# Patient Record
Sex: Male | Born: 1965 | Race: White | Hispanic: No | Marital: Married | Smoking: Current every day smoker
Health system: Southern US, Community
[De-identification: ages and names within clinical notes are randomized; demographics above are authoritative.]

## PROBLEM LIST (undated history)

## (undated) DIAGNOSIS — M1990 Unspecified osteoarthritis, unspecified site: Secondary | ICD-10-CM

## (undated) DIAGNOSIS — E079 Disorder of thyroid, unspecified: Secondary | ICD-10-CM

## (undated) DIAGNOSIS — Z973 Presence of spectacles and contact lenses: Secondary | ICD-10-CM

## (undated) DIAGNOSIS — Z789 Other specified health status: Secondary | ICD-10-CM

## (undated) DIAGNOSIS — T7840XA Allergy, unspecified, initial encounter: Secondary | ICD-10-CM

## (undated) HISTORY — DX: Allergy, unspecified, initial encounter: T78.40XA

## (undated) HISTORY — PX: TONSILLECTOMY: SUR1361

## (undated) HISTORY — DX: Disorder of thyroid, unspecified: E07.9

## (undated) HISTORY — DX: Unspecified osteoarthritis, unspecified site: M19.90

---

## 2004-11-24 ENCOUNTER — Emergency Department (HOSPITAL_COMMUNITY): Admission: EM | Admit: 2004-11-24 | Discharge: 2004-11-24 | Payer: Self-pay | Admitting: Emergency Medicine

## 2007-09-18 ENCOUNTER — Emergency Department (HOSPITAL_COMMUNITY): Admission: EM | Admit: 2007-09-18 | Discharge: 2007-09-18 | Payer: Self-pay | Admitting: Emergency Medicine

## 2013-10-22 ENCOUNTER — Other Ambulatory Visit: Payer: Self-pay | Admitting: Orthopedic Surgery

## 2013-10-22 ENCOUNTER — Encounter (HOSPITAL_BASED_OUTPATIENT_CLINIC_OR_DEPARTMENT_OTHER): Payer: Self-pay | Admitting: *Deleted

## 2013-10-22 NOTE — Progress Notes (Signed)
No labs needed

## 2013-10-26 ENCOUNTER — Encounter (HOSPITAL_BASED_OUTPATIENT_CLINIC_OR_DEPARTMENT_OTHER): Payer: Managed Care, Other (non HMO) | Admitting: Anesthesiology

## 2013-10-26 ENCOUNTER — Ambulatory Visit (HOSPITAL_BASED_OUTPATIENT_CLINIC_OR_DEPARTMENT_OTHER)
Admission: RE | Admit: 2013-10-26 | Discharge: 2013-10-26 | Disposition: A | Discharge status: Home / Self Care | Payer: Managed Care, Other (non HMO) | Source: Ambulatory Visit | Attending: Orthopedic Surgery | Admitting: Orthopedic Surgery

## 2013-10-26 ENCOUNTER — Ambulatory Visit (HOSPITAL_BASED_OUTPATIENT_CLINIC_OR_DEPARTMENT_OTHER): Payer: Managed Care, Other (non HMO) | Admitting: Anesthesiology

## 2013-10-26 ENCOUNTER — Encounter (HOSPITAL_BASED_OUTPATIENT_CLINIC_OR_DEPARTMENT_OTHER): Payer: Self-pay | Admitting: Orthopedic Surgery

## 2013-10-26 ENCOUNTER — Encounter (HOSPITAL_BASED_OUTPATIENT_CLINIC_OR_DEPARTMENT_OTHER)
Admission: RE | Disposition: A | Discharge status: Home / Self Care | Payer: Self-pay | Source: Ambulatory Visit | Attending: Orthopedic Surgery

## 2013-10-26 DIAGNOSIS — Z6831 Body mass index (BMI) 31.0-31.9, adult: Secondary | ICD-10-CM | POA: Diagnosis not present

## 2013-10-26 DIAGNOSIS — R229 Localized swelling, mass and lump, unspecified: Secondary | ICD-10-CM | POA: Diagnosis present

## 2013-10-26 DIAGNOSIS — L723 Sebaceous cyst: Secondary | ICD-10-CM | POA: Insufficient documentation

## 2013-10-26 DIAGNOSIS — F17200 Nicotine dependence, unspecified, uncomplicated: Secondary | ICD-10-CM | POA: Insufficient documentation

## 2013-10-26 DIAGNOSIS — E669 Obesity, unspecified: Secondary | ICD-10-CM | POA: Diagnosis not present

## 2013-10-26 HISTORY — DX: Other specified health status: Z78.9

## 2013-10-26 HISTORY — PX: MASS EXCISION: SHX2000

## 2013-10-26 HISTORY — DX: Presence of spectacles and contact lenses: Z97.3

## 2013-10-26 LAB — POCT HEMOGLOBIN-HEMACUE: HEMOGLOBIN: 16 g/dL (ref 13.0–17.0)

## 2013-10-26 SURGERY — EXCISION MASS
Anesthesia: Monitor Anesthesia Care | Site: Finger | Laterality: Left

## 2013-10-26 MED ORDER — LACTATED RINGERS IV SOLN
INTRAVENOUS | Status: DC
  Administered 2013-10-26: 10:00:00 via INTRAVENOUS

## 2013-10-26 MED ORDER — FENTANYL CITRATE 0.05 MG/ML IJ SOLN: 50.0000 ug | INTRAMUSCULAR | Status: DC | PRN

## 2013-10-26 MED ORDER — FENTANYL CITRATE 0.05 MG/ML IJ SOLN
INTRAMUSCULAR | Status: AC
  Filled 2013-10-26: qty 2

## 2013-10-26 MED ORDER — PROMETHAZINE HCL 25 MG/ML IJ SOLN: 6.2500 mg | INTRAMUSCULAR | Status: DC | PRN

## 2013-10-26 MED ORDER — MEPERIDINE HCL 25 MG/ML IJ SOLN: 6.2500 mg | INTRAMUSCULAR | Status: DC | PRN

## 2013-10-26 MED ORDER — LIDOCAINE HCL (CARDIAC) 20 MG/ML IV SOLN
INTRAVENOUS | Status: DC | PRN
  Administered 2013-10-26: 50 mg via INTRAVENOUS

## 2013-10-26 MED ORDER — MIDAZOLAM HCL 5 MG/5ML IJ SOLN
INTRAMUSCULAR | Status: DC | PRN
  Administered 2013-10-26: 2 mg via INTRAVENOUS

## 2013-10-26 MED ORDER — FENTANYL CITRATE 0.05 MG/ML IJ SOLN
INTRAMUSCULAR | Status: DC | PRN
  Administered 2013-10-26: 100 ug via INTRAVENOUS

## 2013-10-26 MED ORDER — CEFAZOLIN SODIUM-DEXTROSE 2-3 GM-% IV SOLR
INTRAVENOUS | Status: AC
  Filled 2013-10-26: qty 50

## 2013-10-26 MED ORDER — BUPIVACAINE HCL (PF) 0.25 % IJ SOLN
INTRAMUSCULAR | Status: DC | PRN
  Administered 2013-10-26: 6 mL

## 2013-10-26 MED ORDER — CEFAZOLIN SODIUM-DEXTROSE 2-3 GM-% IV SOLR: 2.0000 g | INTRAVENOUS | Status: DC

## 2013-10-26 MED ORDER — PROPOFOL 10 MG/ML IV BOLUS
INTRAVENOUS | Status: DC | PRN
  Administered 2013-10-26 (×2): 30 mg via INTRAVENOUS

## 2013-10-26 MED ORDER — MIDAZOLAM HCL 2 MG/2ML IJ SOLN: 1.0000 mg | INTRAMUSCULAR | Status: DC | PRN

## 2013-10-26 MED ORDER — OXYCODONE HCL 5 MG PO TABS: 5.0000 mg | ORAL_TABLET | Freq: Once | ORAL | Status: DC | PRN

## 2013-10-26 MED ORDER — HYDROCODONE-ACETAMINOPHEN 5-325 MG PO TABS: 1.0000 | ORAL_TABLET | Freq: Four times a day (QID) | ORAL | Status: DC | PRN

## 2013-10-26 MED ORDER — CHLORHEXIDINE GLUCONATE 4 % EX LIQD: 60.0000 mL | Freq: Once | CUTANEOUS | Status: DC

## 2013-10-26 MED ORDER — FENTANYL CITRATE 0.05 MG/ML IJ SOLN
25.0000 ug | INTRAMUSCULAR | Status: DC | PRN
  Administered 2013-10-26: 50 ug via INTRAVENOUS

## 2013-10-26 MED ORDER — OXYCODONE HCL 5 MG/5ML PO SOLN: 5.0000 mg | Freq: Once | ORAL | Status: DC | PRN

## 2013-10-26 MED ORDER — BUPIVACAINE HCL (PF) 0.25 % IJ SOLN
INTRAMUSCULAR | Status: AC
  Filled 2013-10-26: qty 30

## 2013-10-26 MED ORDER — FENTANYL CITRATE 0.05 MG/ML IJ SOLN
INTRAMUSCULAR | Status: AC
  Filled 2013-10-26: qty 4

## 2013-10-26 MED ORDER — ONDANSETRON HCL 4 MG/2ML IJ SOLN
INTRAMUSCULAR | Status: DC | PRN
Start: 2013-10-26 — End: 2013-10-26
  Administered 2013-10-26: 4 mg via INTRAVENOUS

## 2013-10-26 MED ORDER — MIDAZOLAM HCL 2 MG/2ML IJ SOLN
INTRAMUSCULAR | Status: AC
  Filled 2013-10-26: qty 2

## 2013-10-26 MED ORDER — LIDOCAINE HCL (PF) 0.5 % IJ SOLN
INTRAMUSCULAR | Status: DC | PRN
  Administered 2013-10-26: 30 mL via INTRAVENOUS

## 2013-10-26 SURGICAL SUPPLY — 53 items
BANDAGE COBAN STERILE 2 (GAUZE/BANDAGES/DRESSINGS) IMPLANT
BLADE MINI RND TIP GREEN BEAV (BLADE) IMPLANT
BLADE SURG 15 STRL LF DISP TIS (BLADE) ×1 IMPLANT
BLADE SURG 15 STRL SS (BLADE) ×2
BNDG COHESIVE 1X5 TAN STRL LF (GAUZE/BANDAGES/DRESSINGS) ×3 IMPLANT
BNDG COHESIVE 3X5 TAN STRL LF (GAUZE/BANDAGES/DRESSINGS) IMPLANT
BNDG ESMARK 4X9 LF (GAUZE/BANDAGES/DRESSINGS) IMPLANT
BNDG GAUZE ELAST 4 BULKY (GAUZE/BANDAGES/DRESSINGS) IMPLANT
CHLORAPREP W/TINT 26ML (MISCELLANEOUS) ×3 IMPLANT
CORDS BIPOLAR (ELECTRODE) ×3 IMPLANT
COVER MAYO STAND STRL (DRAPES) ×3 IMPLANT
COVER TABLE BACK 60X90 (DRAPES) ×3 IMPLANT
CUFF TOURNIQUET SINGLE 18IN (TOURNIQUET CUFF) ×3 IMPLANT
DECANTER SPIKE VIAL GLASS SM (MISCELLANEOUS) IMPLANT
DRAIN PENROSE 1/2X12 LTX STRL (WOUND CARE) IMPLANT
DRAPE EXTREMITY T 121X128X90 (DRAPE) ×3 IMPLANT
DRAPE SURG 17X23 STRL (DRAPES) ×3 IMPLANT
GAUZE SPONGE 4X4 12PLY STRL (GAUZE/BANDAGES/DRESSINGS) ×3 IMPLANT
GAUZE XEROFORM 1X8 LF (GAUZE/BANDAGES/DRESSINGS) ×3 IMPLANT
GLOVE BIO SURGEON STRL SZ7.5 (GLOVE) ×3 IMPLANT
GLOVE BIOGEL PI IND STRL 7.0 (GLOVE) ×2 IMPLANT
GLOVE BIOGEL PI IND STRL 8 (GLOVE) ×2 IMPLANT
GLOVE BIOGEL PI IND STRL 8.5 (GLOVE) ×1 IMPLANT
GLOVE BIOGEL PI INDICATOR 7.0 (GLOVE) ×4
GLOVE BIOGEL PI INDICATOR 8 (GLOVE) ×4
GLOVE BIOGEL PI INDICATOR 8.5 (GLOVE) ×2
GLOVE ECLIPSE 6.5 STRL STRAW (GLOVE) ×3 IMPLANT
GLOVE SURG ORTHO 8.0 STRL STRW (GLOVE) ×3 IMPLANT
GOWN STRL REUS W/ TWL LRG LVL3 (GOWN DISPOSABLE) ×1 IMPLANT
GOWN STRL REUS W/TWL LRG LVL3 (GOWN DISPOSABLE) ×2
GOWN STRL REUS W/TWL XL LVL3 (GOWN DISPOSABLE) ×6 IMPLANT
NDL SAFETY ECLIPSE 18X1.5 (NEEDLE) IMPLANT
NEEDLE 27GAX1X1/2 (NEEDLE) ×3 IMPLANT
NEEDLE HYPO 18GX1.5 SHARP (NEEDLE)
NS IRRIG 1000ML POUR BTL (IV SOLUTION) ×3 IMPLANT
PACK BASIN DAY SURGERY FS (CUSTOM PROCEDURE TRAY) ×3 IMPLANT
PAD CAST 3X4 CTTN HI CHSV (CAST SUPPLIES) IMPLANT
PADDING CAST ABS 3INX4YD NS (CAST SUPPLIES)
PADDING CAST ABS 4INX4YD NS (CAST SUPPLIES)
PADDING CAST ABS COTTON 3X4 (CAST SUPPLIES) IMPLANT
PADDING CAST ABS COTTON 4X4 ST (CAST SUPPLIES) IMPLANT
PADDING CAST COTTON 3X4 STRL (CAST SUPPLIES)
SPLINT FINGER 3.25 911903 (SOFTGOODS) ×3 IMPLANT
SPLINT PLASTER CAST XFAST 3X15 (CAST SUPPLIES) IMPLANT
SPLINT PLASTER XTRA FASTSET 3X (CAST SUPPLIES)
STOCKINETTE 4X48 STRL (DRAPES) ×3 IMPLANT
SUT VIC AB 4-0 P2 18 (SUTURE) IMPLANT
SUT VICRYL RAPID 5 0 P 3 (SUTURE) IMPLANT
SUT VICRYL RAPIDE 4/0 PS 2 (SUTURE) ×3 IMPLANT
SYR BULB 3OZ (MISCELLANEOUS) ×3 IMPLANT
SYR CONTROL 10ML LL (SYRINGE) ×3 IMPLANT
TOWEL OR 17X24 6PK STRL BLUE (TOWEL DISPOSABLE) ×3 IMPLANT
UNDERPAD 30X30 INCONTINENT (UNDERPADS AND DIAPERS) ×3 IMPLANT

## 2013-10-26 NOTE — Brief Op Note (Signed)
10/26/2013  11:11 AM  PATIENT:  Keith Jennings  48 y.o. male  PRE-OPERATIVE DIAGNOSIS:  MASS LEFT MIDDLE FINGER   POST-OPERATIVE DIAGNOSIS:  MASS LEFT MIDDLE FINGER   PROCEDURE:  Procedure(s): EXCISION MASS LEFT MIDDLE FINGER  (Left)  SURGEON:  Surgeon(s) and Role:    * Nicki ReaperGary R Norene Oliveri, MD - Primary    * Tami RibasKevin R Amaliya Whitelaw, MD - Assisting  PHYSICIAN ASSISTANT:   ASSISTANTS: Karlyn AgeeK Jerrian Mells, MD   ANESTHESIA:   local and regional  EBL:     BLOOD ADMINISTERED:none  DRAINS: none   LOCAL MEDICATIONS USED:  BUPIVICAINE   SPECIMEN:  Excision  DISPOSITION OF SPECIMEN:  PATHOLOGY  COUNTS:  YES  TOURNIQUET:   Total Tourniquet Time Documented: Forearm (Left) - 22 minutes Total: Forearm (Left) - 22 minutes   DICTATION: .Other Dictation: Dictation Number [id number]  PLAN OF CARE: Discharge to home after PACU  PATIENT DISPOSITION:  PACU - hemodynamically stable.

## 2013-10-26 NOTE — Anesthesia Preprocedure Evaluation (Signed)
Anesthesia Evaluation  Patient identified by MRN, date of birth, ID band Patient awake    Reviewed: Allergy & Precautions, H&P , NPO status , Patient's Chart, lab work & pertinent test results  History of Anesthesia Complications Negative for: history of anesthetic complications  Airway Mallampati: II TM Distance: >3 FB Neck ROM: Full    Dental  (+) Poor Dentition, Dental Advisory Given   Pulmonary Current Smoker,  breath sounds clear to auscultation  Pulmonary exam normal       Cardiovascular - anginanegative cardio ROS  Rhythm:Regular Rate:Normal     Neuro/Psych negative neurological ROS     GI/Hepatic negative GI ROS, Neg liver ROS,   Endo/Other  negative endocrine ROS  Renal/GU negative Renal ROS     Musculoskeletal   Abdominal (+) + obese,   Peds  Hematology negative hematology ROS (+)   Anesthesia Other Findings   Reproductive/Obstetrics                           Anesthesia Physical Anesthesia Plan  ASA: II  Anesthesia Plan: MAC and Bier Block   Post-op Pain Management:    Induction:   Airway Management Planned: Natural Airway and Simple Face Mask  Additional Equipment:   Intra-op Plan:   Post-operative Plan:   Informed Consent: I have reviewed the patients History and Physical, chart, labs and discussed the procedure including the risks, benefits and alternatives for the proposed anesthesia with the patient or authorized representative who has indicated his/her understanding and acceptance.   Dental advisory given  Plan Discussed with: CRNA and Surgeon  Anesthesia Plan Comments: (Plan routine monitors, MAC with IV regional Lidocaine)        Anesthesia Quick Evaluation

## 2013-10-26 NOTE — H&P (Signed)
  Keith Jennings is a 48 year old right hand dominant male with a mass on his left middle finger proximal phalanx volar aspect. He states it has been present for 2-3 months and enlarging. It has been mildly painful for him. He recalls no history of injury. No history of diabetes, thyroid problems, arthritis or gout. There is a family history of diabetes. He has been tested.  PAST MEDICAL HISTORY: He has no known drug allergies. He is currently taking no medications. He has had no surgery.   FAMILY H ISTORY: Positive for diabetes, high BP and arthritis.  SOCIAL HISTORY: He smokes 1  PPD and is advised to quit and the reasons behind this. He drinks socially. He is married. 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  REVIEW OF SYSTEMS: Positive for glasses otherwise negative for 14 points. Keith Jennings is an 48 y.o. male.   Chief Complaint: mass left middle finger HPI: see above  Past Medical History  Diagnosis Date  . Medical history non-contributory   . Wears glasses     Past Surgical History  Procedure Laterality Date  . Tonsillectomy      History reviewed. No pertinent family history. Social History:  reports that he has been smoking.  He does not have any smokeless tobacco history on file. He reports that he drinks alcohol. He reports that he does not use illicit drugs.  Allergies: No Known Allergies  No prescriptions prior to admission    No results found for this or any previous visit (from the past 48 hour(s)).  No results found.   Pertinent items are noted in HPI.  Height 6' (1.829 m), weight 106.595 kg (235 lb).  General appearance: alert, cooperative and appears stated age Head: Normocephalic, without obvious abnormality Neck: no JVD Resp: clear to auscultation bilaterally Cardio: regular rate and rhythm, S1, S2 normal, no murmur, click, rub or gallop GI: soft, non-tender; bowel sounds normal; no masses,  no organomegaly Extremities: mass mass left  middle finger Pulses: 2+ and symmetric Skin: Skin color, texture, turgor normal. No rashes or lesions Neurologic: Grossly normal Incision/Wound: na  Assessment/Plan X-rays reveal a soft tissue mass.  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   He is advised this may be a giant cell tumor, may be an epidermal inclusion cyst, or gout. There is a family history of gout. It may be a blood vessel or nerve tumor. We have discussed the possibility of surgical removal of this. The pre, peri and post op course are discussed along with risks and complications.  He is aware there is no guarantee with surgery, possibility of infection, recurrence, injury to arteries, nerves and tendons, incomplete relief of symptoms and dystrophy.  He would like to have this removed. He is advised there is always the potential this again could be a blood vessel or nerve tumor. He has requested to proceed to have this done. This will be scheduled as an outpatient under regional anesthesia.  Orville Mena R 10/26/2013, 7:39 AM

## 2013-10-26 NOTE — Anesthesia Postprocedure Evaluation (Signed)
  Anesthesia Post-op Note  Patient: Keith Jennings  Procedure(s) Performed: Procedure(s): EXCISION MASS LEFT MIDDLE FINGER  (Left)  Patient Location: PACU  Anesthesia Type:Bier block  Level of Consciousness: awake, alert , oriented and patient cooperative  Airway and Oxygen Therapy: Patient Spontanous Breathing  Post-op Pain: none  Post-op Assessment: Post-op Vital signs reviewed, Patient's Cardiovascular Status Stable, Respiratory Function Stable, Patent Airway, No signs of Nausea or vomiting and Pain level controlled  Post-op Vital Signs: Reviewed and stable  Last Vitals:  Filed Vitals:   10/26/13 1145  BP:   Pulse: 81  Temp:   Resp: 22    Complications: No apparent anesthesia complications

## 2013-10-26 NOTE — Discharge Instructions (Addendum)

## 2013-10-26 NOTE — Anesthesia Procedure Notes (Signed)
Procedure Name: MAC Date/Time: 10/26/2013 10:41 AM Performed by: Zenia ResidesPAYNE, Cariann Kinnamon D Pre-anesthesia Checklist: Patient identified, Timeout performed, Emergency Drugs available, Suction available and Patient being monitored Oxygen Delivery Method: Simple face mask

## 2013-10-26 NOTE — Transfer of Care (Signed)
Immediate Anesthesia Transfer of Care Note  Patient: Keith LintsWilliam M Rieth  Procedure(s) Performed: Procedure(s): EXCISION MASS LEFT MIDDLE FINGER  (Left)  Patient Location: PACU  Anesthesia Type:MAC and Bier block  Level of Consciousness: awake, alert  and oriented  Airway & Oxygen Therapy: Patient Spontanous Breathing and Patient connected to face mask oxygen  Post-op Assessment: Report given to PACU RN and Post -op Vital signs reviewed and stable  Post vital signs: Reviewed and stable  Complications: No apparent anesthesia complications

## 2013-10-26 NOTE — Op Note (Signed)
Dictation Number [id number]

## 2013-10-27 ENCOUNTER — Encounter (HOSPITAL_BASED_OUTPATIENT_CLINIC_OR_DEPARTMENT_OTHER): Payer: Self-pay | Admitting: Orthopedic Surgery

## 2013-10-27 NOTE — Op Note (Signed)
NAME:  Jillyn HiddenGLIDEWELL, Keith Jennings           ACCOUNT NO.:  [id number]  MEDICAL RECORD NO.:  [id number]  LOCATION:                                 FACILITY:  PHYSICIAN:  Cindee SaltGary Grasiela Jonsson, M.D.       DATE OF BIRTH:  1965/06/09  DATE OF PROCEDURE:  10/26/2013 DATE OF DISCHARGE:  10/26/2013                              OPERATIVE REPORT   PREOPERATIVE DIAGNOSIS:  Mass, left middle finger.  POSTOPERATIVE DIAGNOSIS:  Mass, left middle finger.  OPERATION:  Excisional biopsy mass, left middle finger.  SURGEON:  Cindee SaltGary Hjalmar Ballengee, M.D.  ASSISTANT:  Betha LoaKevin Maisen Schmit, MD  ANESTHESIA:  Forearm IV regional sedation and metacarpal block.  ANESTHESIOLOGISJean Rosenthal:  [place].  HISTORY:  The patient is a 48 year old male with a history of a large mass on the palmar aspect of his left middle finger.  This is slightly to the ulnar side.  He is desirous of surgically removing this.  Pre, peri, and postoperative course have been discussed along with risks and complications.  He is aware that there is no guarantee with the surgery, possibility of infection; recurrence of injury to arteries, nerves, tendons; incomplete relief of symptoms, dystrophy.  In the preoperative area, the patient is seen, the extremity marked by both patient and surgeon.  Antibiotic given.  PROCEDURE IN DETAIL:  The patient was brought to the operating room, where a forearm-based IV regional anesthetic was carried out without difficulty.  He was prepped using ChloraPrep, supine position, left arm free.  A 3-minute dry time was allowed.  Time-out taken, confirming the patient and procedure.  Oblique incision was made over the proximal phalanx, carried down through subcutaneous tissue.  A large pearl white mass was immediately encountered with blunt and sharp dissection.  This unfortunately broke and that it was densely adherent to the skin.  This was then carefully dissected after removal of the cheesy anterior.  The area to the skin was dissected free to  attempt to remove any of the wall.  The cyst was then entirely removed, sent to Pathology. Neurovascular bundles were identified.  These were found to be markedly scarred by the surrounding capsule and pressure from the mass.  The wound was copiously irrigated with saline.  The skin then closed with interrupted 4-0 Vicryl Rapide sutures.  Metacarpal block was given 0.25% Marcaine without epinephrine.  A sterile compressive dressing and splint to the finger applied.  On deflation of the tourniquet, all fingers immediately pinked.  He was taken to the recovery room for observation in satisfactory condition.  He will be discharged home to [facility] in 1 week on Vicodin.          ______________________________ Cindee SaltGary Daaron Dimarco, M.D.     GK/MEDQ  D:  10/26/2013  T:  10/26/2013  Job:  [id number]

## 2014-11-03 ENCOUNTER — Encounter: Payer: Self-pay | Admitting: Internal Medicine

## 2017-02-07 ENCOUNTER — Encounter: Payer: Self-pay | Admitting: Gastroenterology

## 2017-04-04 ENCOUNTER — Ambulatory Visit (AMBULATORY_SURGERY_CENTER): Payer: Self-pay | Admitting: *Deleted

## 2017-04-04 ENCOUNTER — Other Ambulatory Visit: Payer: Self-pay

## 2017-04-04 VITALS — Ht 72.0 in | Wt 266.6 lb

## 2017-04-04 DIAGNOSIS — Z1211 Encounter for screening for malignant neoplasm of colon: Secondary | ICD-10-CM

## 2017-04-04 NOTE — Progress Notes (Signed)
No egg or soy allergy known to patient  No issues with past sedation with any surgeries  or procedures, no intubation problems  No diet pills per patient No home 02 use per patient  No blood thinners per patient  Pt denies issues with constipation  No A fib or A flutter  EMMI video sent to pt's e mail  

## 2017-04-09 ENCOUNTER — Encounter: Payer: Self-pay | Admitting: Gastroenterology

## 2017-04-18 ENCOUNTER — Encounter: Payer: Managed Care, Other (non HMO) | Admitting: Gastroenterology

## 2017-05-30 ENCOUNTER — Encounter: Payer: Managed Care, Other (non HMO) | Admitting: Gastroenterology

## 2019-11-17 DIAGNOSIS — Z125 Encounter for screening for malignant neoplasm of prostate: Secondary | ICD-10-CM | POA: Diagnosis not present

## 2019-11-17 DIAGNOSIS — E039 Hypothyroidism, unspecified: Secondary | ICD-10-CM | POA: Diagnosis not present

## 2019-11-17 DIAGNOSIS — Z0000 Encounter for general adult medical examination without abnormal findings: Secondary | ICD-10-CM | POA: Diagnosis not present

## 2019-11-24 DIAGNOSIS — E039 Hypothyroidism, unspecified: Secondary | ICD-10-CM | POA: Diagnosis not present

## 2019-11-24 DIAGNOSIS — Z0000 Encounter for general adult medical examination without abnormal findings: Secondary | ICD-10-CM | POA: Diagnosis not present

## 2019-11-24 DIAGNOSIS — R82998 Other abnormal findings in urine: Secondary | ICD-10-CM | POA: Diagnosis not present

## 2019-11-25 DIAGNOSIS — Z1212 Encounter for screening for malignant neoplasm of rectum: Secondary | ICD-10-CM | POA: Diagnosis not present

## 2020-07-05 DIAGNOSIS — J069 Acute upper respiratory infection, unspecified: Secondary | ICD-10-CM | POA: Diagnosis not present

## 2020-07-05 DIAGNOSIS — R059 Cough, unspecified: Secondary | ICD-10-CM | POA: Diagnosis not present

## 2020-08-04 DIAGNOSIS — M25531 Pain in right wrist: Secondary | ICD-10-CM | POA: Diagnosis not present

## 2020-08-04 DIAGNOSIS — M654 Radial styloid tenosynovitis [de Quervain]: Secondary | ICD-10-CM | POA: Diagnosis not present

## 2020-09-15 DIAGNOSIS — M654 Radial styloid tenosynovitis [de Quervain]: Secondary | ICD-10-CM | POA: Diagnosis not present

## 2020-09-15 DIAGNOSIS — M25531 Pain in right wrist: Secondary | ICD-10-CM | POA: Diagnosis not present

## 2020-12-07 DIAGNOSIS — E039 Hypothyroidism, unspecified: Secondary | ICD-10-CM | POA: Diagnosis not present

## 2020-12-07 DIAGNOSIS — R7303 Prediabetes: Secondary | ICD-10-CM | POA: Diagnosis not present

## 2020-12-07 DIAGNOSIS — Z125 Encounter for screening for malignant neoplasm of prostate: Secondary | ICD-10-CM | POA: Diagnosis not present

## 2020-12-12 DIAGNOSIS — E669 Obesity, unspecified: Secondary | ICD-10-CM | POA: Diagnosis not present

## 2020-12-12 DIAGNOSIS — F17200 Nicotine dependence, unspecified, uncomplicated: Secondary | ICD-10-CM | POA: Diagnosis not present

## 2020-12-12 DIAGNOSIS — E039 Hypothyroidism, unspecified: Secondary | ICD-10-CM | POA: Diagnosis not present

## 2020-12-12 DIAGNOSIS — Z0000 Encounter for general adult medical examination without abnormal findings: Secondary | ICD-10-CM | POA: Diagnosis not present

## 2020-12-12 DIAGNOSIS — R7303 Prediabetes: Secondary | ICD-10-CM | POA: Diagnosis not present

## 2020-12-12 DIAGNOSIS — Z1331 Encounter for screening for depression: Secondary | ICD-10-CM | POA: Diagnosis not present

## 2020-12-12 DIAGNOSIS — Z1339 Encounter for screening examination for other mental health and behavioral disorders: Secondary | ICD-10-CM | POA: Diagnosis not present

## 2021-06-30 ENCOUNTER — Emergency Department (HOSPITAL_COMMUNITY)
Admission: EM | Admit: 2021-06-30 | Discharge: 2021-06-30 | Disposition: A | Discharge status: Home / Self Care | Payer: BC Managed Care – PPO | Attending: Emergency Medicine | Admitting: Emergency Medicine

## 2021-06-30 ENCOUNTER — Emergency Department (HOSPITAL_COMMUNITY): Payer: BC Managed Care – PPO

## 2021-06-30 ENCOUNTER — Encounter (HOSPITAL_COMMUNITY): Payer: Self-pay | Admitting: Emergency Medicine

## 2021-06-30 DIAGNOSIS — M19011 Primary osteoarthritis, right shoulder: Secondary | ICD-10-CM | POA: Diagnosis not present

## 2021-06-30 DIAGNOSIS — W109XXA Fall (on) (from) unspecified stairs and steps, initial encounter: Secondary | ICD-10-CM | POA: Insufficient documentation

## 2021-06-30 DIAGNOSIS — M25511 Pain in right shoulder: Secondary | ICD-10-CM | POA: Diagnosis not present

## 2021-06-30 DIAGNOSIS — G8911 Acute pain due to trauma: Secondary | ICD-10-CM | POA: Insufficient documentation

## 2021-06-30 DIAGNOSIS — W19XXXA Unspecified fall, initial encounter: Secondary | ICD-10-CM

## 2021-06-30 DIAGNOSIS — Y9301 Activity, walking, marching and hiking: Secondary | ICD-10-CM | POA: Insufficient documentation

## 2021-06-30 DIAGNOSIS — M533 Sacrococcygeal disorders, not elsewhere classified: Secondary | ICD-10-CM | POA: Diagnosis not present

## 2021-06-30 DIAGNOSIS — M7989 Other specified soft tissue disorders: Secondary | ICD-10-CM | POA: Diagnosis not present

## 2021-06-30 DIAGNOSIS — S4991XA Unspecified injury of right shoulder and upper arm, initial encounter: Secondary | ICD-10-CM | POA: Diagnosis not present

## 2021-06-30 DIAGNOSIS — M25531 Pain in right wrist: Secondary | ICD-10-CM | POA: Diagnosis not present

## 2021-06-30 DIAGNOSIS — M25532 Pain in left wrist: Secondary | ICD-10-CM | POA: Diagnosis not present

## 2021-06-30 LAB — I-STAT CHEM 8, ED
BUN: 13 mg/dL (ref 6–20)
Calcium, Ion: 1.11 mmol/L — ABNORMAL LOW (ref 1.15–1.40)
Chloride: 107 mmol/L (ref 98–111)
Creatinine, Ser: 1.4 mg/dL — ABNORMAL HIGH (ref 0.61–1.24)
Glucose, Bld: 90 mg/dL (ref 70–99)
HCT: 44 % (ref 39.0–52.0)
Hemoglobin: 15 g/dL (ref 13.0–17.0)
Potassium: 3.9 mmol/L (ref 3.5–5.1)
Sodium: 141 mmol/L (ref 135–145)
TCO2: 22 mmol/L (ref 22–32)

## 2021-06-30 NOTE — ED Provider Notes (Signed)
I saw and evaluated the patient, reviewed the resident's note and I agree with the findings and plan. ? ?EKG Interpretation ? ?Date/Time:  Saturday June 30 2021 20:19:21 EDT ?Ventricular Rate:  89 ?PR Interval:  151 ?QRS Duration: 118 ?QT Interval:  355 ?QTC Calculation: 432 ?R Axis:   69 ?Text Interpretation: Sinus rhythm Incomplete right bundle branch block Confirmed by Lorre Nick ([id number]) on 06/30/2021 8:23:81 PM  ? ?56 year old male presents after a fall today.  Denies any syncope.  The injury was right shoulder and self medicated with several shots of liquor.  On exam he does have limited range of motion to his right shoulder but no evidence of dislocation.  Plain x-rays are negative.  His EKG per my interpretation shows normal sinus rhythm with a incomplete right bundle branch block.  Will check hemoglobin and electrolytes ?  ?Lorre Nick, MD ?06/30/21 2025 ? ?

## 2021-06-30 NOTE — ED Provider Notes (Signed)
?[facility] EMERGENCY DEPARTMENT ?Provider Note ? ? ?CSN: [id number] ?Arrival date & time: 06/30/21  1833 ? ?  ? ?History ? ?Chief Complaint  ?Patient presents with  ? Fall  ? ? ?Keith Jennings is a 56 y.o. male presenting to the ED for right shoulder pain after 2 falls today.  The first fall he reports is tripping while walking in his yard and he fell forward to catch his self with hands extended.  Patient fell again later while walking down the stairs and fell onto his right shoulder.  He reports drinking 6 shots of liquor.  Patient denies hitting his head.  He denies any other external trauma.  No headache, neck pain, or back pain.  No abdominal pain or pelvic pain. ? ? ?Fall ? ? ?  ? ?Home Medications ?Prior to Admission medications   ?Medication Sig Start Date End Date Taking? Authorizing Provider  ?albuterol (PROVENTIL) (2.5 MG/3ML) 0.083% nebulizer solution Take 2.5 mg by nebulization every 6 (six) hours as needed for wheezing or shortness of breath.    [provider]  ?levothyroxine (SYNTHROID, LEVOTHROID) 100 MCG tablet Take 100 mcg by mouth daily. 01/12/17   [provider]  ?   ? ?Allergies    ?Patient has no known allergies.   ? ?Review of Systems   ?Review of Systems  ?Constitutional:  Negative for chills and fever.  ?Musculoskeletal:  Positive for arthralgias and myalgias. Negative for back pain and gait problem.  ?Skin:  Negative for rash and wound.  ? ?Physical Exam ?Updated Vital Signs ?BP 119/72   Pulse 84   Temp 98.6 ?F (37 ?C) (Oral)   Resp (!) 23   SpO2 95%  ?Physical Exam ?Vitals and nursing note reviewed.  ?Constitutional:   ?   General: He is not in acute distress. ?Eyes:  ?   Extraocular Movements: Extraocular movements intact.  ?   Pupils: Pupils are equal, round, and reactive to light.  ?Cardiovascular:  ?   Rate and Rhythm: Normal rate and regular rhythm.  ?   Pulses: Normal pulses.  ?   Heart sounds: Normal heart sounds.  ?Pulmonary:  ?    Effort: Pulmonary effort is normal. No respiratory distress.  ?Abdominal:  ?   General: Abdomen is flat.  ?   Tenderness: There is no abdominal tenderness. There is no guarding or rebound.  ?Musculoskeletal:     ?   General: Swelling, tenderness and signs of injury present.  ?   Cervical back: Normal range of motion. No rigidity or tenderness.  ?   Right lower leg: No edema.  ?   Left lower leg: No edema.  ?   Comments: NO T/L/ sacral tenderness ?Chest and pelvis stable  ?Neurological:  ?   Mental Status: He is alert.  ? ? ?ED Results / Procedures / Treatments   ?Labs ?(all labs ordered are listed, but only abnormal results are displayed) ?Labs Reviewed  ?I-STAT CHEM 8, ED - Abnormal; Notable for the following components:  ?    Result Value  ? Creatinine, Ser 1.40 (*)   ? Calcium, Ion 1.11 (*)   ? All other components within normal limits  ? ? ?EKG ?EKG Interpretation ? ?Date/Time:  Saturday June 30 2021 20:19:21 EDT ?Ventricular Rate:  89 ?PR Interval:  151 ?QRS Duration: 118 ?QT Interval:  355 ?QTC Calculation: 432 ?R Axis:   69 ?Text Interpretation: Sinus rhythm Incomplete right bundle branch block Confirmed by Lorre Nick (  [id number]) on 06/30/2021 8:23:42 PM ? ?Radiology ?DG Shoulder Right ? ?Result Date: 06/30/2021 ?CLINICAL DATA:  Shoulder and wrist pain history of fall EXAM: RIGHT SHOULDER - 2+ VIEW COMPARISON:  None. FINDINGS: No fracture or malalignment.  Mild AC joint degenerative change. IMPRESSION: No acute osseous abnormality Electronically Signed   By: Jasmine Pang M.D.   On: 06/30/2021 19:33  ? ?DG Wrist Complete Left ? ?Result Date: 06/30/2021 ?CLINICAL DATA:  Fall with wrist pain EXAM: LEFT WRIST - COMPLETE 3+ VIEW COMPARISON:  None. FINDINGS: There is no evidence of fracture or dislocation. There is no evidence of arthropathy or other focal bone abnormality. Soft tissues are unremarkable. IMPRESSION: Negative. Electronically Signed   By: Jasmine Pang M.D.   On: 06/30/2021 19:34  ? ?DG Wrist Complete  Right ? ?Result Date: 06/30/2021 ?CLINICAL DATA:  Fall with wrist pain EXAM: RIGHT WRIST - COMPLETE 3+ VIEW COMPARISON:  None. FINDINGS: No definitive fracture or malalignment. Corticated osseous density adjacent to scaphoid is felt nonacute. Soft tissue swelling at the wrist. IMPRESSION: No definite acute osseous abnormality is seen. Electronically Signed   By: Jasmine Pang M.D.   On: 06/30/2021 19:37   ? ?Procedures ?Procedures  ? ?Medications Ordered in ED ?Medications - No data to display ? ?ED Course/ Medical Decision Making/ A&P ?  ?                        ?Medical Decision Making ?Amount and/or Complexity of Data Reviewed ?Radiology: ordered. ? ? ?56 year old male presenting to the ED after 2 falls and drinking alcohol.  Patient reports they are both caused by him tripping.  His main pain is in his right shoulder.  On chart review, shows prior orthopedic surgeries but limited records. ? ?On exam, neurovascular tact.  No other external signs of trauma.  Tenderness palpation of right shoulder but has almost full range of motion somewhat limited when he lifts his arm apically.  Sensation intact.  EKG obtained and is unremarkable.  Plain films of the right shoulder and bilateral wrist negative for any fractures on my review which is confirmed by radiology.  Patient is normal sinus rhythm on the monitor/telemetry on my review. ? ?I-STAT Chem-8 shows a mildly increased creatinine at 1.40.  Patient instructed to drink more water and is likely secondary to dehydration.  Patient and wife feel comfortable plan to discharge home. ? ?Final Clinical Impression(s) / ED Diagnoses ?Final diagnoses:  ?Acute pain of right shoulder  ?Fall, initial encounter  ? ? ?Rx / DC Orders ?ED Discharge Orders   ? ? None  ? ?  ? ? ?  ?Micheline Maze, MD ?07/01/21 1606 ? ?  ?Lorre Nick, MD ?07/02/21 1400 ? ?

## 2021-06-30 NOTE — Discharge Instructions (Signed)
You were evaluated in the Emergency Department and after careful evaluation, we did not find any emergent condition requiring admission or further testing in the hospital.  Your exam/testing today was overall reassuring.  Please return to the Emergency Department if you experience any worsening of your condition.  Thank you for allowing us to be a part of your care.  

## 2021-06-30 NOTE — ED Triage Notes (Signed)
Patient complains of bilateral wrist and right shoulder pain after two falls today. The first fall he was walking in his yard, tripped, and fell forward catching himself with his hands extended. Then patient fell again later while walking down stairs and fell onto his right shoulder. No LOC with either fall, denies dizziness. After second fall, patient drank six shots of liquor.  ?

## 2021-06-30 NOTE — Progress Notes (Signed)
Orthopedic Tech Progress Note ?Patient Details:  ?Keith Jennings ?[birth year] ?[id number] ? ?Ortho Devices ?Type of Ortho Device: Sling immobilizer ?Ortho Device/Splint Location: rue ?Ortho Device/Splint Interventions: Ordered, Application, Adjustment ?  ?Post Interventions ?Patient Tolerated: Well ?Instructions Provided: Care of device, Adjustment of device ? ?Trinna Post ?06/30/2021, 8:30 PM ? ?

## 2021-08-08 DIAGNOSIS — M654 Radial styloid tenosynovitis [de Quervain]: Secondary | ICD-10-CM | POA: Diagnosis not present

## 2021-08-14 DIAGNOSIS — M654 Radial styloid tenosynovitis [de Quervain]: Secondary | ICD-10-CM | POA: Diagnosis not present

## 2021-12-07 DIAGNOSIS — E039 Hypothyroidism, unspecified: Secondary | ICD-10-CM | POA: Diagnosis not present

## 2021-12-07 DIAGNOSIS — Z0000 Encounter for general adult medical examination without abnormal findings: Secondary | ICD-10-CM | POA: Diagnosis not present

## 2021-12-07 DIAGNOSIS — R7303 Prediabetes: Secondary | ICD-10-CM | POA: Diagnosis not present

## 2021-12-28 ENCOUNTER — Other Ambulatory Visit: Payer: Self-pay | Admitting: Family Medicine

## 2021-12-28 ENCOUNTER — Ambulatory Visit: Payer: Self-pay

## 2021-12-28 DIAGNOSIS — M25562 Pain in left knee: Secondary | ICD-10-CM

## 2022-02-06 DIAGNOSIS — Z125 Encounter for screening for malignant neoplasm of prostate: Secondary | ICD-10-CM | POA: Diagnosis not present

## 2022-02-06 DIAGNOSIS — Z23 Encounter for immunization: Secondary | ICD-10-CM | POA: Diagnosis not present

## 2022-02-06 DIAGNOSIS — Z87891 Personal history of nicotine dependence: Secondary | ICD-10-CM | POA: Diagnosis not present

## 2022-02-06 DIAGNOSIS — E039 Hypothyroidism, unspecified: Secondary | ICD-10-CM | POA: Diagnosis not present

## 2022-02-06 DIAGNOSIS — Z1331 Encounter for screening for depression: Secondary | ICD-10-CM | POA: Diagnosis not present

## 2022-02-06 DIAGNOSIS — Z8709 Personal history of other diseases of the respiratory system: Secondary | ICD-10-CM | POA: Diagnosis not present

## 2022-02-07 ENCOUNTER — Other Ambulatory Visit: Payer: Self-pay | Admitting: Physician Assistant

## 2022-02-07 DIAGNOSIS — Z87891 Personal history of nicotine dependence: Secondary | ICD-10-CM

## 2022-05-17 DIAGNOSIS — J309 Allergic rhinitis, unspecified: Secondary | ICD-10-CM | POA: Diagnosis not present

## 2022-05-17 DIAGNOSIS — R6889 Other general symptoms and signs: Secondary | ICD-10-CM | POA: Diagnosis not present

## 2022-05-17 DIAGNOSIS — Z87891 Personal history of nicotine dependence: Secondary | ICD-10-CM | POA: Diagnosis not present

## 2022-05-17 DIAGNOSIS — J329 Chronic sinusitis, unspecified: Secondary | ICD-10-CM | POA: Diagnosis not present

## 2022-06-07 ENCOUNTER — Encounter: Payer: Self-pay | Admitting: Physician Assistant

## 2022-06-13 ENCOUNTER — Ambulatory Visit
Admission: RE | Admit: 2022-06-13 | Discharge: 2022-06-13 | Disposition: A | Discharge status: Home / Self Care | Payer: BC Managed Care – PPO | Source: Ambulatory Visit | Attending: Physician Assistant | Admitting: Physician Assistant

## 2022-06-13 DIAGNOSIS — Z87891 Personal history of nicotine dependence: Secondary | ICD-10-CM

## 2022-06-13 DIAGNOSIS — F17210 Nicotine dependence, cigarettes, uncomplicated: Secondary | ICD-10-CM | POA: Diagnosis not present

## 2022-07-23 ENCOUNTER — Ambulatory Visit: Payer: BC Managed Care – PPO | Admitting: Allergy

## 2022-07-23 ENCOUNTER — Encounter: Payer: Self-pay | Admitting: Allergy

## 2022-07-23 VITALS — BP 112/66 | HR 78 | Resp 16 | Ht 73.0 in | Wt 268.2 lb

## 2022-07-23 DIAGNOSIS — J329 Chronic sinusitis, unspecified: Secondary | ICD-10-CM

## 2022-07-23 DIAGNOSIS — J3089 Other allergic rhinitis: Secondary | ICD-10-CM | POA: Diagnosis not present

## 2022-07-23 DIAGNOSIS — J4520 Mild intermittent asthma, uncomplicated: Secondary | ICD-10-CM

## 2022-07-23 MED ORDER — RYALTRIS 665-25 MCG/ACT NA SUSP: NASAL | 5 refills | Status: AC

## 2022-07-23 MED ORDER — LEVOCETIRIZINE DIHYDROCHLORIDE 5 MG PO TABS: 5.0000 mg | ORAL_TABLET | Freq: Every evening | ORAL | 5 refills | Status: AC

## 2022-07-23 NOTE — Progress Notes (Signed)
New Patient Note  RE: Keith Jennings MRN: [id number] DOB: 31-Jul-1965 Date of Office Visit: 07/23/2022  Primary care provider: Lonie Peak, PA-C  Chief Complaint: allergies  History of present illness: Keith Jennings is a 57 y.o. male presenting today for evaluation of allergic sinusitis.  He presents today with his wife.   He states when his sinuses are draining he has a "god awful taste in the back of my throat".  This drainage can then drain into his chest and cause respiratory symptoms. He states he can have sinus pressure around the nose and cheeks, increased mucus that is thick.  Symptoms seem to worsen in the night time.  He reports getting sinus infections about twice a year (spring and fall).  He has been treated with Z-pak and steroids in the past which usually help.  He states he has used flonase or afrin nasal sprays when congestion is severe.  He states many many years ago Claritin use to work.   He had increase of these symptoms around Jan/Feb and was treated with antibiotic but states symptoms have continued on since.  He states he has an albuterol inhaler and albuterol for nebulizer.   He states he was prescribed an albuterol inhaler at one time for his sinus infection.  He reports having shortness of breath that's worse at night due to mucus that is thick.  He states the albuterol helps to break up the mucus that settles in the chest.  He states he sometimes hears a wheeze which tends to resolve as he gets up and starts moving around for the day.  He has been ENT about 9-10 years ago for ringing in the ears.  He still has the ringing in his ears.    Review of systems: Review of Systems  Constitutional: Negative.   HENT:         See HPI  Eyes: Negative.   Respiratory:         See HPI  Cardiovascular: Negative.   Musculoskeletal: Negative.   Skin: Negative.   Allergic/Immunologic: Negative.   Neurological: Negative.     All other systems negative  unless noted above in HPI  Past medical history: Past Medical History:  Diagnosis Date   Allergy    Arthritis    Medical history non-contributory    Thyroid disease    Wears glasses     Past surgical history: Past Surgical History:  Procedure Laterality Date   MASS EXCISION Left 10/26/2013   Procedure: EXCISION MASS LEFT MIDDLE FINGER ;  Surgeon: Cindee Salt, MD;  Location: [facility];  Service: Orthopedics;  Laterality: Left;   TONSILLECTOMY      Family history:  Family History  Problem Relation Age of Onset   Colon cancer Neg Hx    Colon polyps Neg Hx    Esophageal cancer Neg Hx    Rectal cancer Neg Hx    Stomach cancer Neg Hx     Social history: Lives in a home without carpeting with heat pump heating and cooling.  Dogs in the home.  No concern for water damage, mildew or roaches in the home.  He is a Emergency planning/management officer in [employer].  He reports a smoking history of 1 pack/day cigarettes since 1985.    Medication List: Current Outpatient Medications  Medication Sig Dispense Refill   albuterol (PROVENTIL) (2.5 MG/3ML) 0.083% nebulizer solution Take 2.5 mg by nebulization every 6 (six) hours as needed for wheezing or shortness of  breath.     albuterol (VENTOLIN HFA) 108 (90 Base) MCG/ACT inhaler Inhale 2 puffs into the lungs every 4 (four) hours as needed.     levocetirizine (XYZAL) 5 MG tablet Take 1 tablet (5 mg total) by mouth every evening. 30 tablet 5   levothyroxine (SYNTHROID, LEVOTHROID) 100 MCG tablet Take 100 mcg by mouth daily.  2   Olopatadine-Mometasone (RYALTRIS) [id number] MCG/ACT SUSP two sprays per nostril 1-2 times daily as needed 29 g 5   No current facility-administered medications for this visit.    Known medication allergies: No Known Allergies   Physical examination: Blood pressure 112/66, pulse 78, resp. rate 16, height 6\' 1"  (1.854 m), weight 268 lb 3.2 oz (121.7 kg), SpO2 98 %.  General: Alert, interactive, in no acute distress. HEENT:  PERRLA, TMs pearly gray, turbinates mildly edematous without discharge, post-pharynx non erythematous. Neck: Supple without lymphadenopathy. Lungs: Clear to auscultation without wheezing, rhonchi or rales. {no increased work of breathing. CV: Normal S1, S2 without murmurs. Abdomen: Nondistended, nontender. Skin: Warm and dry, without lesions or rashes. Extremities:  No clubbing, cyanosis or edema. Neuro:   Grossly intact.  Diagnositics/Labs:  Spirometry: FEV1: 3.0L 73%, FVC: 3.96L 74% predicted.  Near normal for age/demographics  Allergy testing:   Airborne Adult Perc - 07/23/22 1554     Time Antigen Placed 1554    Allergen Manufacturer Waynette Buttery    Location Back    Number of Test 59    Panel 1 Select    1. Control-Buffer 50% Glycerol Negative    2. Control-Histamine 1 mg/ml 2+    3. Albumin saline Negative    4. Bahia Negative    5. French Southern Territories Negative    6. Johnson Negative    7. Kentucky Blue Negative    8. Meadow Fescue Negative    9. Perennial Rye Negative    10. Sweet Vernal 2+    11. Timothy Negative    12. Cocklebur 2+    13. Burweed Marshelder Negative    14. Ragweed, short Negative    15. Ragweed, Giant Negative    16. Plantain,  English Negative    17. Lamb's Quarters Negative    18. Sheep Sorrell Negative    19. Rough Pigweed Negative    20. Marsh Elder, Rough Negative    21. Mugwort, Common Negative    22. Ash mix Negative    23. Birch mix 2+    24. Beech American Negative    25. Box, Elder Negative    26. Cedar, red Negative    27. Cottonwood, Guinea-Bissau Negative    28. Elm mix Negative    29. Hickory Negative    30. Maple mix Negative    31. Oak, Guinea-Bissau mix Negative    32. Pecan Pollen Negative    33. Pine mix Negative    34. Sycamore Eastern Negative    35. Walnut, Black Pollen Negative    36. Alternaria alternata Negative    37. Cladosporium Herbarum 2+    38. Aspergillus mix 2+    39. Penicillium mix Negative    40. Bipolaris sorokiniana  (Helminthosporium) Negative    41. Drechslera spicifera (Curvularia) Negative    42. Mucor plumbeus Negative    43. Fusarium moniliforme Negative    44. Aureobasidium pullulans (pullulara) Negative    45. Rhizopus oryzae Negative    46. Botrytis cinera Negative    47. Epicoccum nigrum Negative    48. Phoma betae Negative    49. Candida  Albicans Negative    50. Trichophyton mentagrophytes Negative    51. Mite, D Farinae  5,000 AU/ml Negative    52. Mite, D Pteronyssinus  5,000 AU/ml Negative    53. Cat Hair 10,000 BAU/ml Negative    54.  Dog Epithelia Negative    55. Mixed Feathers Negative    56. Horse Epithelia Negative    57. Cockroach, German Negative    58. Mouse Negative    59. Tobacco Leaf Negative             Intradermal - 07/23/22 1529     Time Antigen Placed 1529    Allergen Manufacturer Waynette Buttery    Location Arm    Number of Test 8    Intradermal Select    Control Negative    Ragweed mix 2+    Mold 3 Negative    Mold 4 Negative    Cat Negative    Dog Negative    Cockroach Negative    Mite mix Negative             Allergy testing results were read and interpreted by provider, documented by clinical staff.   Assessment and plan: Chronic allergic rhinosinusitis - Testing today showed: grasses, ragweed, weeds, trees, indoor molds, and outdoor molds. - Copy of test results provided.  - Avoidance measures provided. - Start taking at least during spring and fall seasons:  Xyzal (levocetirizine) 5mg  tablet once daily. Ryaltris (olopatadine/mometasone) two sprays per nostril 1-2 times daily as needed for nasal congestion/drainage.  This is a combination nasal spray.   If unable to get Ryaltris consider Xhance.  - You can use an extra dose of the antihistamine, if needed, for breakthrough symptoms.  - Consider nasal saline rinses 1-2 times daily to remove allergens from the nasal cavities as well as help with mucous clearance (this is especially helpful to do  before the nasal sprays are given) - Consider allergy shots as a means of long-term control. - Allergy shots "re-train" and "reset" the immune system to ignore environmental allergens and decrease the resulting immune response to those allergens (sneezing, itchy watery eyes, runny nose, nasal congestion, etc).    - Allergy shots improve symptoms in 75-85% of patients.  - We can discuss more at the next appointment if the medications are not working for you.  - Lung function testing is close to normal for age  - Have access to albuterol inhaler 2 puffs every 4-6 hours as needed for cough/wheeze/shortness of breath/chest tightness.  May use 15-20 minutes prior to activity.   Monitor frequency of use.    Follow-up in 4-6 months or sooner if needed  I appreciate the opportunity to take part in [employer] care. Please do not hesitate to contact me with questions.  Sincerely,   Margo Aye, MD Allergy/Immunology [facility]

## 2022-07-23 NOTE — Patient Instructions (Signed)
-   Testing today showed: grasses, ragweed, weeds, trees, indoor molds, and outdoor molds. - Copy of test results provided.  - Avoidance measures provided. - Start taking at least during spring and fall seasons:  Xyzal (levocetirizine) 5mg  tablet once daily. Ryaltris (olopatadine/mometasone) two sprays per nostril 1-2 times daily as needed for nasal congestion/drainage.  This is a combination nasal spray.   - You can use an extra dose of the antihistamine, if needed, for breakthrough symptoms.  - Consider nasal saline rinses 1-2 times daily to remove allergens from the nasal cavities as well as help with mucous clearance (this is especially helpful to do before the nasal sprays are given) - Consider allergy shots as a means of long-term control. - Allergy shots "re-train" and "reset" the immune system to ignore environmental allergens and decrease the resulting immune response to those allergens (sneezing, itchy watery eyes, runny nose, nasal congestion, etc).    - Allergy shots improve symptoms in 75-85% of patients.  - We can discuss more at the next appointment if the medications are not working for you.  - Lung function testing is close to normal for age  - Have access to albuterol inhaler 2 puffs every 4-6 hours as needed for cough/wheeze/shortness of breath/chest tightness.  May use 15-20 minutes prior to activity.   Monitor frequency of use.    Follow-up in 4-6 months or sooner if needed

## 2022-07-24 IMAGING — CR DG SHOULDER 2+V*R*
3 series · 3 of 3 positions shown · non-contrast
Comparison: None.

CLINICAL DATA: Shoulder and wrist pain history of fall

EXAM:
RIGHT SHOULDER - 2+ VIEW

[shoulder grashey]
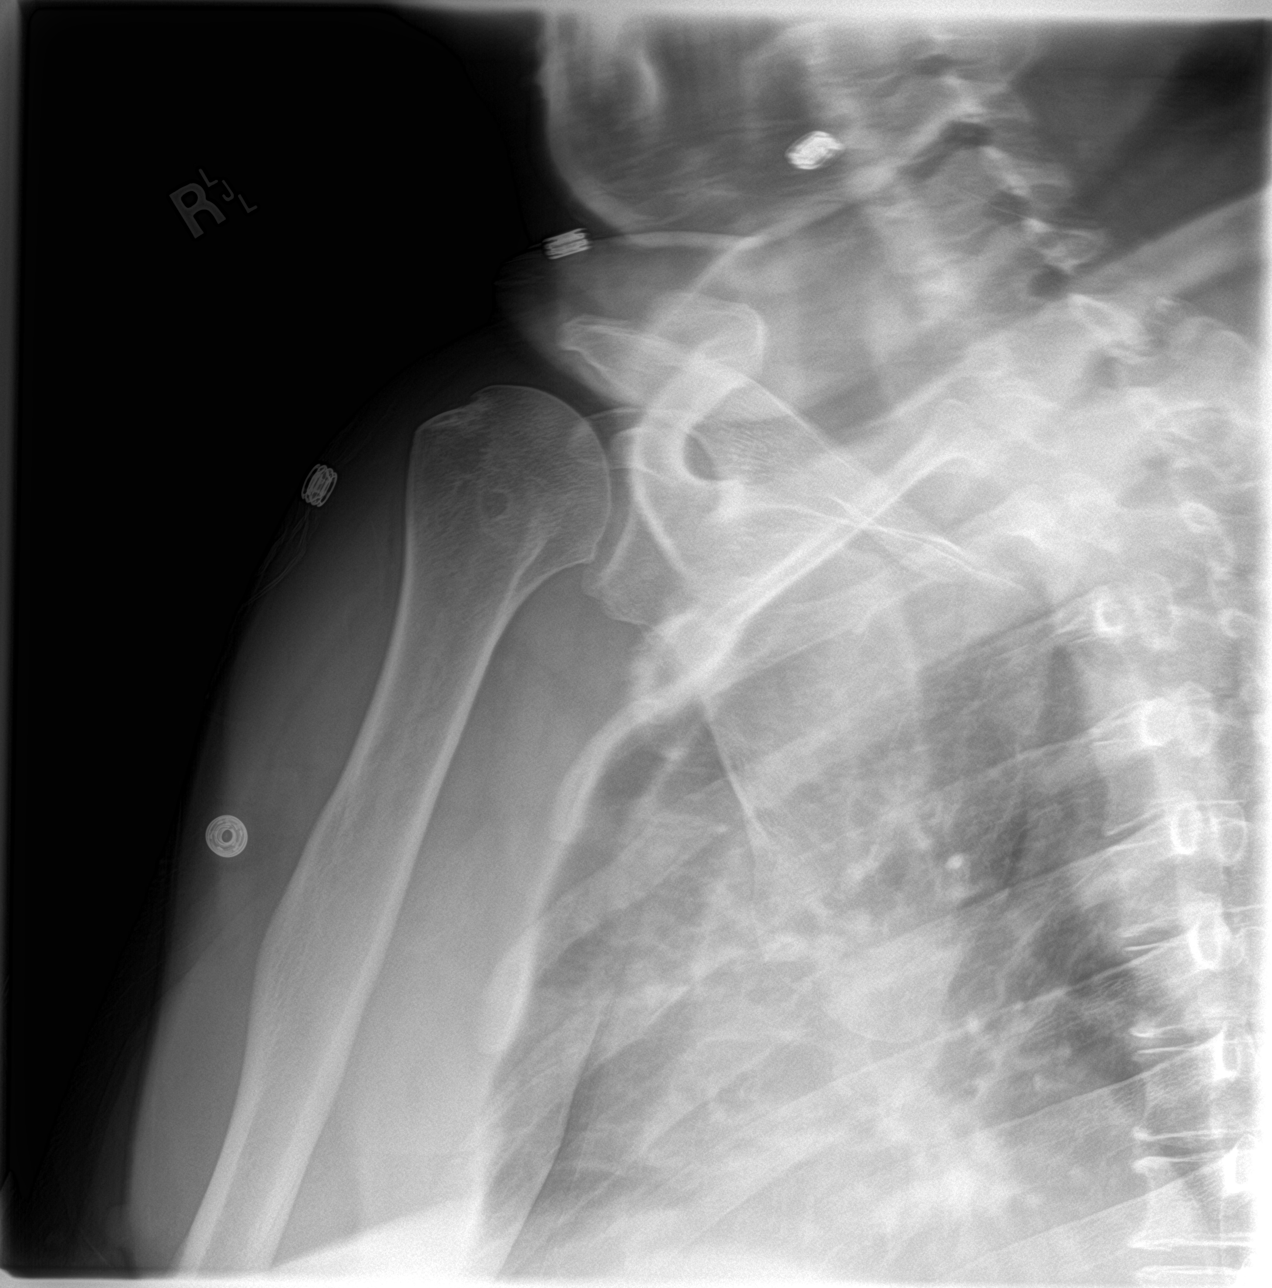

[shoulder y view]
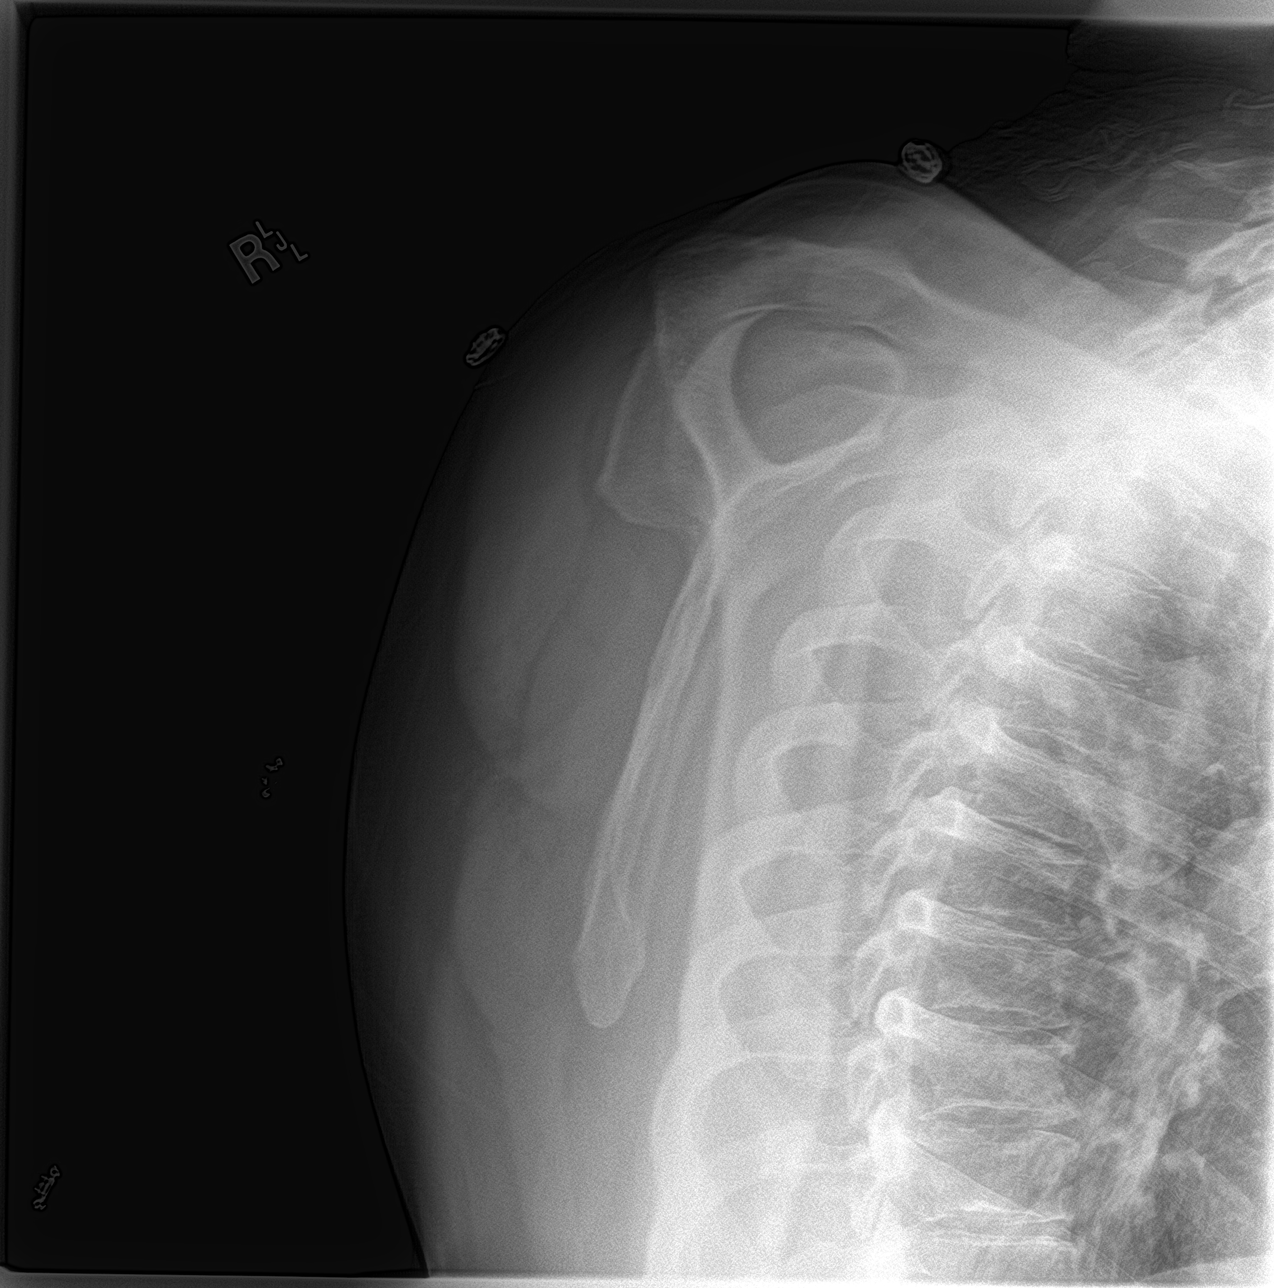

[shoulder ap neutral]
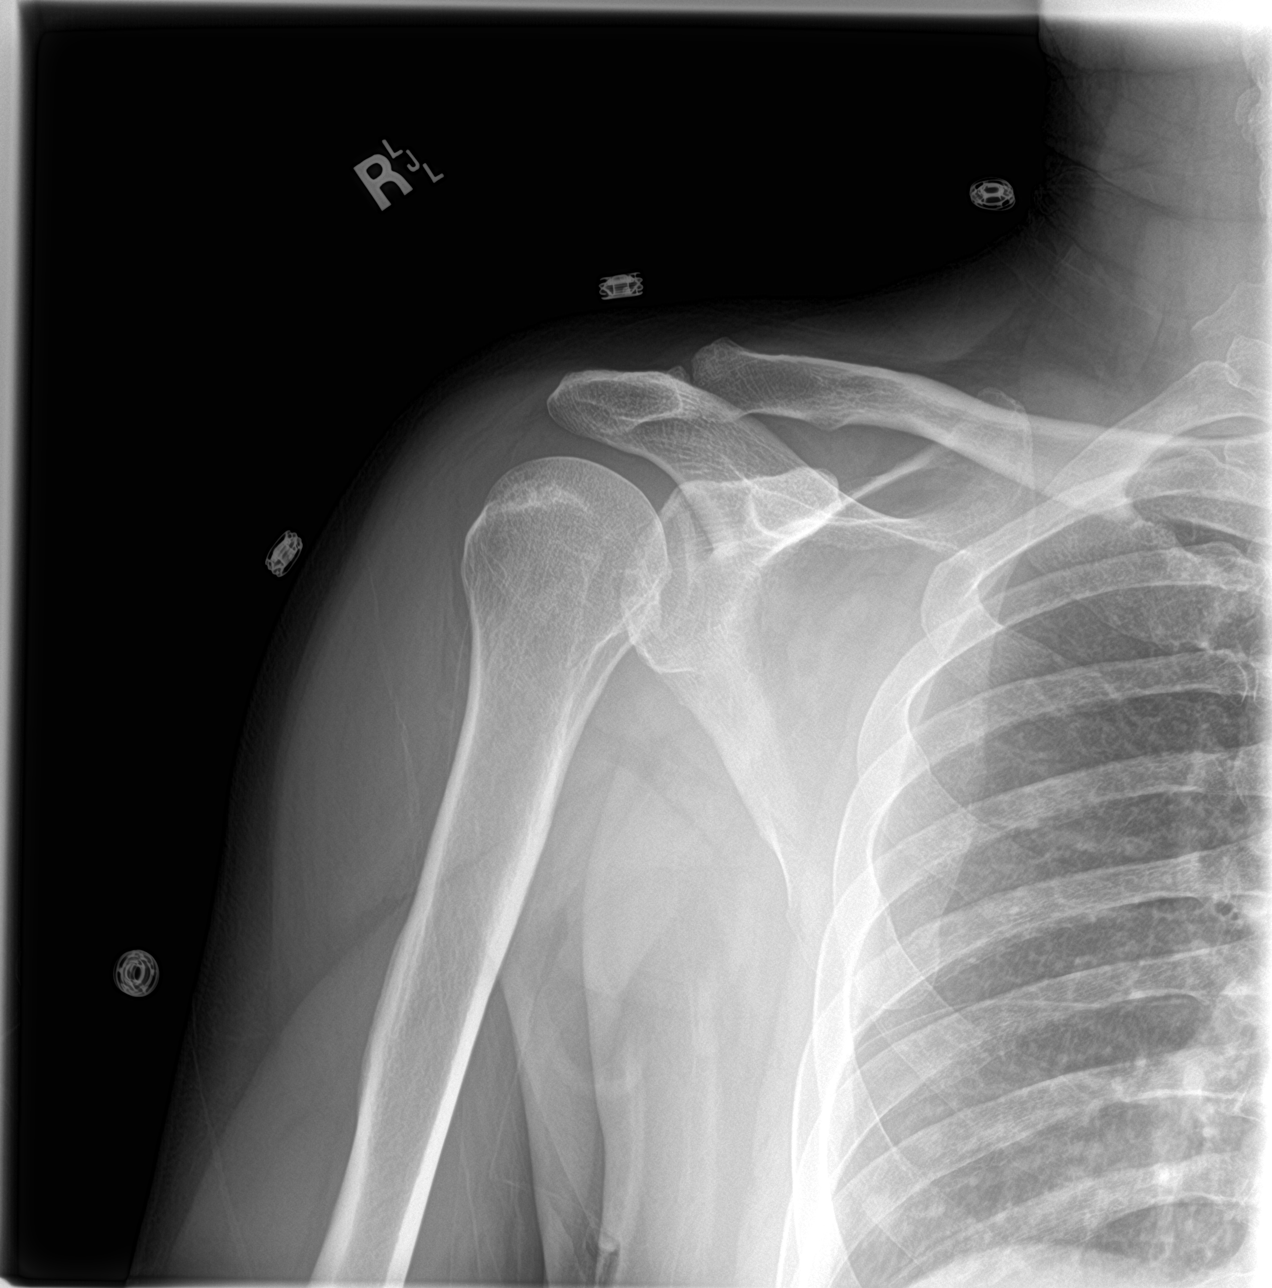

[3 of 3 positions shown; findings below may reference images not displayed]

FINDINGS: No fracture or malalignment.  Mild AC joint degenerative change.
IMPRESSION: No acute osseous abnormality

## 2022-07-24 IMAGING — CR DG WRIST COMPLETE 3+V*R*
4 series · 4 of 4 positions shown · non-contrast
Comparison: None.

CLINICAL DATA: Fall with wrist pain

EXAM:
RIGHT WRIST - COMPLETE 3+ VIEW

[wrist pa]
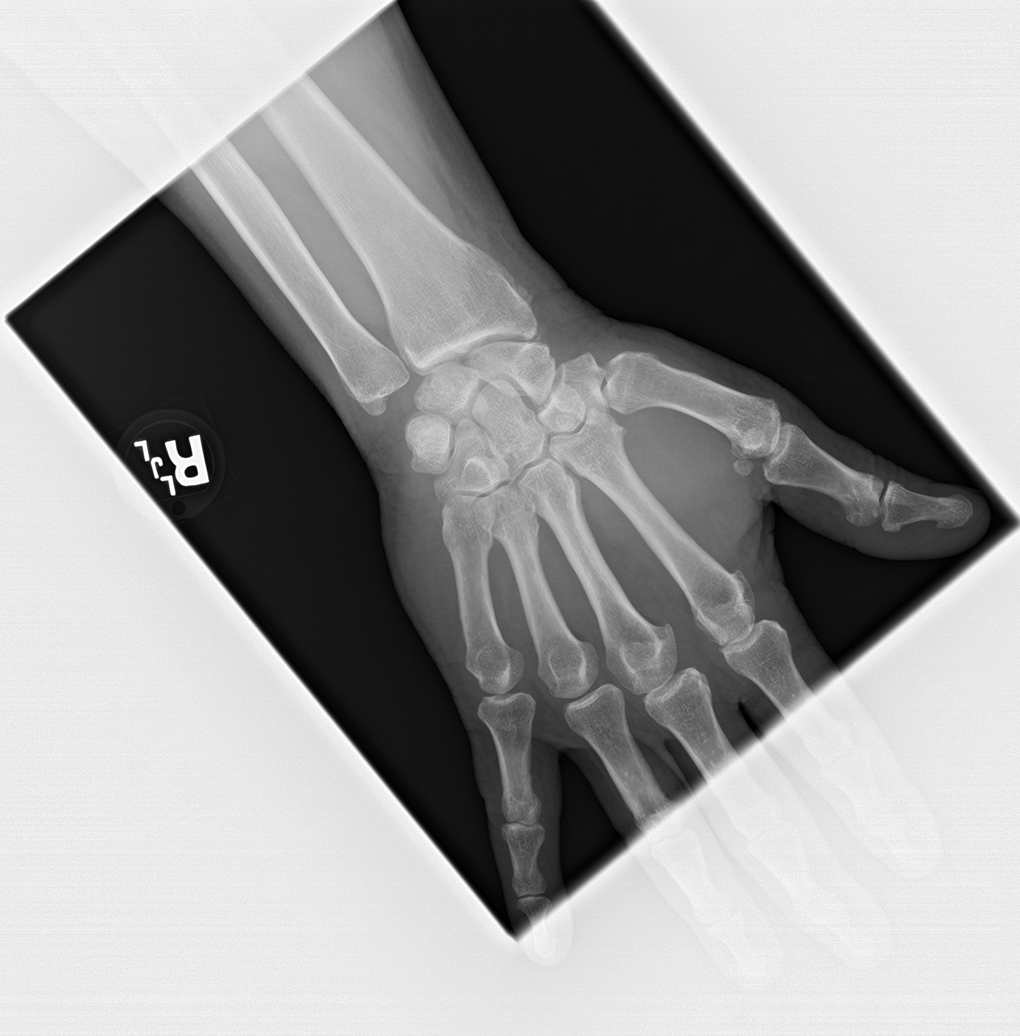

[wrist obl]
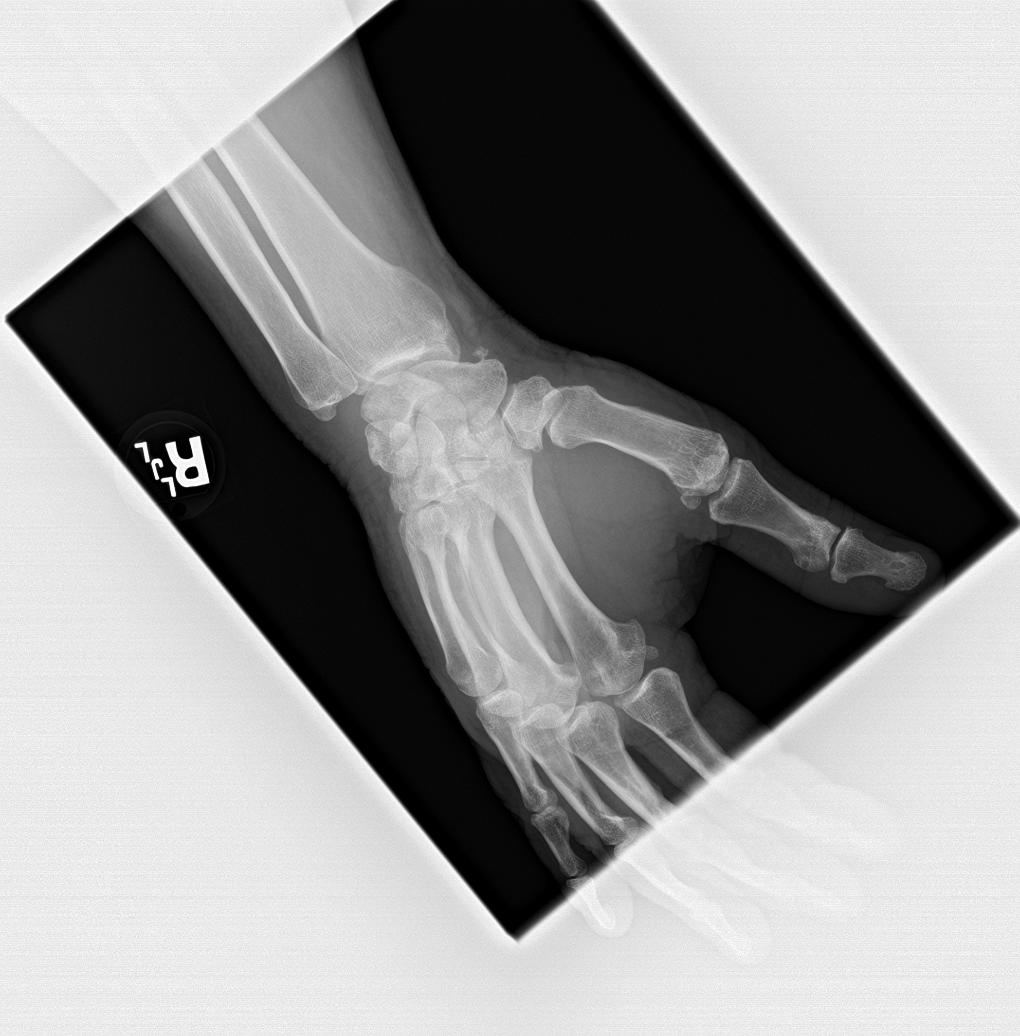

[wrist lat]
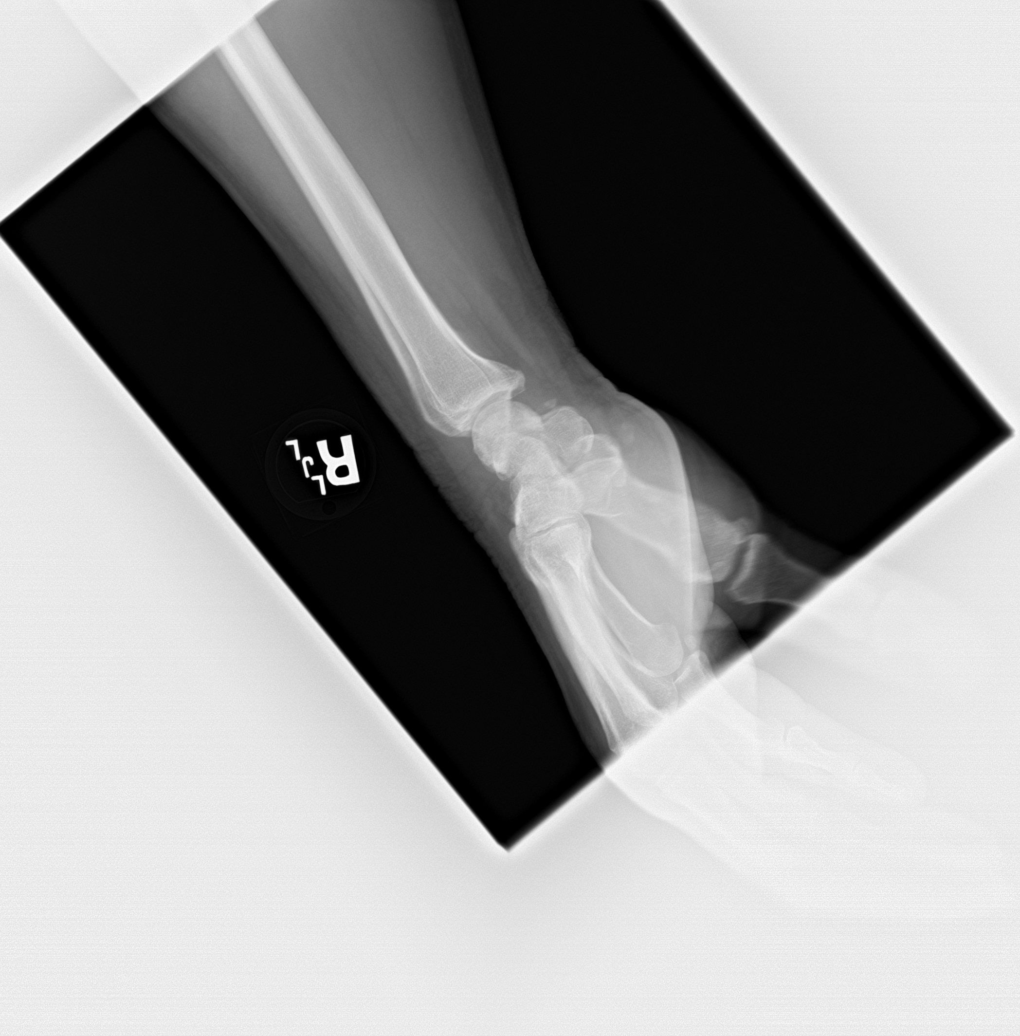

[wrist navicular]
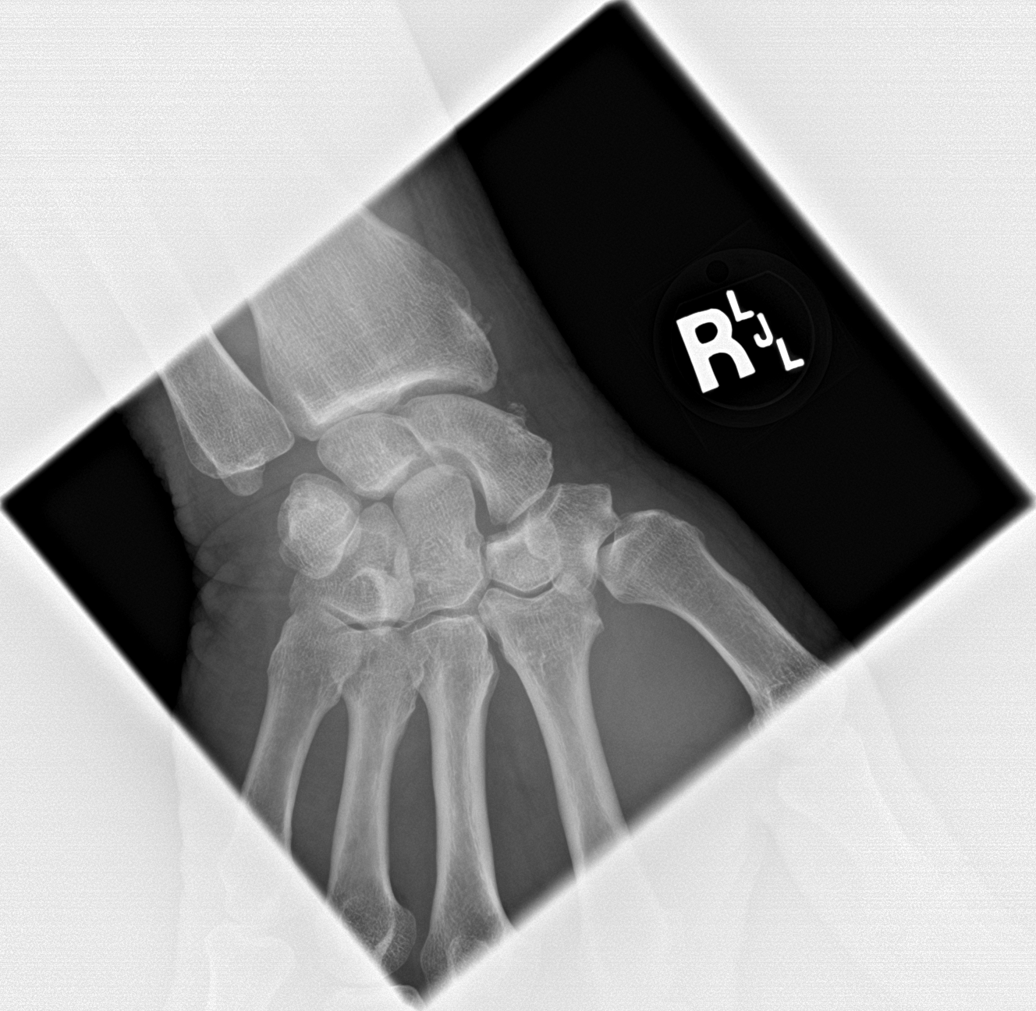

[4 of 4 positions shown; findings below may reference images not displayed]

FINDINGS: No definitive fracture or malalignment. Corticated osseous density
adjacent to scaphoid is felt nonacute. Soft tissue swelling at the
wrist.
IMPRESSION: No definite acute osseous abnormality is seen.

## 2022-08-12 DIAGNOSIS — E039 Hypothyroidism, unspecified: Secondary | ICD-10-CM | POA: Diagnosis not present

## 2022-08-12 DIAGNOSIS — R7303 Prediabetes: Secondary | ICD-10-CM | POA: Diagnosis not present

## 2022-08-12 DIAGNOSIS — J439 Emphysema, unspecified: Secondary | ICD-10-CM | POA: Diagnosis not present

## 2022-08-12 DIAGNOSIS — I2510 Atherosclerotic heart disease of native coronary artery without angina pectoris: Secondary | ICD-10-CM | POA: Diagnosis not present

## 2023-02-26 DIAGNOSIS — I2510 Atherosclerotic heart disease of native coronary artery without angina pectoris: Secondary | ICD-10-CM | POA: Diagnosis not present

## 2023-02-26 DIAGNOSIS — Z1331 Encounter for screening for depression: Secondary | ICD-10-CM | POA: Diagnosis not present

## 2023-02-26 DIAGNOSIS — J439 Emphysema, unspecified: Secondary | ICD-10-CM | POA: Diagnosis not present

## 2023-02-26 DIAGNOSIS — E039 Hypothyroidism, unspecified: Secondary | ICD-10-CM | POA: Diagnosis not present

## 2023-02-26 DIAGNOSIS — Z125 Encounter for screening for malignant neoplasm of prostate: Secondary | ICD-10-CM | POA: Diagnosis not present

## 2023-04-27 DIAGNOSIS — R051 Acute cough: Secondary | ICD-10-CM | POA: Diagnosis not present

## 2023-04-27 DIAGNOSIS — J0100 Acute maxillary sinusitis, unspecified: Secondary | ICD-10-CM | POA: Diagnosis not present

## 2023-04-27 DIAGNOSIS — R6884 Jaw pain: Secondary | ICD-10-CM | POA: Diagnosis not present

## 2023-04-27 DIAGNOSIS — R0981 Nasal congestion: Secondary | ICD-10-CM | POA: Diagnosis not present

## 2023-05-31 DIAGNOSIS — L853 Xerosis cutis: Secondary | ICD-10-CM | POA: Diagnosis not present

## 2023-05-31 DIAGNOSIS — R059 Cough, unspecified: Secondary | ICD-10-CM | POA: Diagnosis not present

## 2023-05-31 DIAGNOSIS — D225 Melanocytic nevi of trunk: Secondary | ICD-10-CM | POA: Diagnosis not present

## 2023-05-31 DIAGNOSIS — D2239 Melanocytic nevi of other parts of face: Secondary | ICD-10-CM | POA: Diagnosis not present

## 2023-05-31 DIAGNOSIS — R0981 Nasal congestion: Secondary | ICD-10-CM | POA: Diagnosis not present

## 2023-05-31 DIAGNOSIS — L820 Inflamed seborrheic keratosis: Secondary | ICD-10-CM | POA: Diagnosis not present

## 2023-05-31 DIAGNOSIS — L570 Actinic keratosis: Secondary | ICD-10-CM | POA: Diagnosis not present

## 2023-06-13 DIAGNOSIS — F17210 Nicotine dependence, cigarettes, uncomplicated: Secondary | ICD-10-CM | POA: Diagnosis not present

## 2023-06-13 DIAGNOSIS — M79645 Pain in left finger(s): Secondary | ICD-10-CM | POA: Diagnosis not present

## 2023-06-13 DIAGNOSIS — Z79899 Other long term (current) drug therapy: Secondary | ICD-10-CM | POA: Diagnosis not present

## 2023-06-13 DIAGNOSIS — E7800 Pure hypercholesterolemia, unspecified: Secondary | ICD-10-CM | POA: Diagnosis not present

## 2023-06-13 DIAGNOSIS — Z79890 Hormone replacement therapy (postmenopausal): Secondary | ICD-10-CM | POA: Diagnosis not present

## 2023-06-13 DIAGNOSIS — S61211A Laceration without foreign body of left index finger without damage to nail, initial encounter: Secondary | ICD-10-CM | POA: Diagnosis not present

## 2023-06-13 DIAGNOSIS — E039 Hypothyroidism, unspecified: Secondary | ICD-10-CM | POA: Diagnosis not present

## 2023-06-13 DIAGNOSIS — W278XXA Contact with other nonpowered hand tool, initial encounter: Secondary | ICD-10-CM | POA: Diagnosis not present

## 2023-08-29 DIAGNOSIS — I2510 Atherosclerotic heart disease of native coronary artery without angina pectoris: Secondary | ICD-10-CM | POA: Diagnosis not present

## 2023-08-29 DIAGNOSIS — R7303 Prediabetes: Secondary | ICD-10-CM | POA: Diagnosis not present

## 2023-08-29 DIAGNOSIS — J439 Emphysema, unspecified: Secondary | ICD-10-CM | POA: Diagnosis not present

## 2023-08-29 DIAGNOSIS — E039 Hypothyroidism, unspecified: Secondary | ICD-10-CM | POA: Diagnosis not present

## 2023-08-30 DIAGNOSIS — R0981 Nasal congestion: Secondary | ICD-10-CM | POA: Diagnosis not present

## 2023-08-30 DIAGNOSIS — R051 Acute cough: Secondary | ICD-10-CM | POA: Diagnosis not present

## 2023-09-03 ENCOUNTER — Other Ambulatory Visit: Payer: Self-pay | Admitting: Physician Assistant

## 2023-09-03 DIAGNOSIS — Z87891 Personal history of nicotine dependence: Secondary | ICD-10-CM

## 2023-09-19 ENCOUNTER — Ambulatory Visit
Admission: RE | Admit: 2023-09-19 | Discharge: 2023-09-19 | Disposition: A | Discharge status: Home / Self Care | Source: Ambulatory Visit | Attending: Physician Assistant | Admitting: Physician Assistant

## 2023-09-19 DIAGNOSIS — Z122 Encounter for screening for malignant neoplasm of respiratory organs: Secondary | ICD-10-CM | POA: Diagnosis not present

## 2023-09-19 DIAGNOSIS — Z87891 Personal history of nicotine dependence: Secondary | ICD-10-CM

## 2023-09-19 DIAGNOSIS — F17210 Nicotine dependence, cigarettes, uncomplicated: Secondary | ICD-10-CM | POA: Diagnosis not present

## 2023-09-21 DIAGNOSIS — J441 Chronic obstructive pulmonary disease with (acute) exacerbation: Secondary | ICD-10-CM | POA: Diagnosis not present

## 2023-09-21 DIAGNOSIS — R0602 Shortness of breath: Secondary | ICD-10-CM | POA: Diagnosis not present

## 2023-09-21 DIAGNOSIS — R079 Chest pain, unspecified: Secondary | ICD-10-CM | POA: Diagnosis not present

## 2023-09-21 DIAGNOSIS — E039 Hypothyroidism, unspecified: Secondary | ICD-10-CM | POA: Diagnosis not present

## 2023-09-21 DIAGNOSIS — E785 Hyperlipidemia, unspecified: Secondary | ICD-10-CM | POA: Diagnosis not present

## 2023-09-21 DIAGNOSIS — Z20822 Contact with and (suspected) exposure to covid-19: Secondary | ICD-10-CM | POA: Diagnosis not present

## 2023-09-21 DIAGNOSIS — R059 Cough, unspecified: Secondary | ICD-10-CM | POA: Diagnosis not present

## 2023-09-21 DIAGNOSIS — F17210 Nicotine dependence, cigarettes, uncomplicated: Secondary | ICD-10-CM | POA: Diagnosis not present

## 2023-09-21 DIAGNOSIS — I4510 Unspecified right bundle-branch block: Secondary | ICD-10-CM | POA: Diagnosis not present

## 2023-09-21 DIAGNOSIS — Z79899 Other long term (current) drug therapy: Secondary | ICD-10-CM | POA: Diagnosis not present

## 2023-09-21 DIAGNOSIS — Z79890 Hormone replacement therapy (postmenopausal): Secondary | ICD-10-CM | POA: Diagnosis not present

## 2023-09-26 DIAGNOSIS — J441 Chronic obstructive pulmonary disease with (acute) exacerbation: Secondary | ICD-10-CM | POA: Diagnosis not present

## 2023-10-03 DIAGNOSIS — I2510 Atherosclerotic heart disease of native coronary artery without angina pectoris: Secondary | ICD-10-CM | POA: Diagnosis not present

## 2023-10-03 DIAGNOSIS — R0602 Shortness of breath: Secondary | ICD-10-CM | POA: Diagnosis not present

## 2023-10-03 DIAGNOSIS — Z87891 Personal history of nicotine dependence: Secondary | ICD-10-CM | POA: Diagnosis not present

## 2023-10-03 DIAGNOSIS — J439 Emphysema, unspecified: Secondary | ICD-10-CM | POA: Diagnosis not present

## 2023-10-04 DIAGNOSIS — Z1212 Encounter for screening for malignant neoplasm of rectum: Secondary | ICD-10-CM | POA: Diagnosis not present

## 2023-10-04 DIAGNOSIS — Z1211 Encounter for screening for malignant neoplasm of colon: Secondary | ICD-10-CM | POA: Diagnosis not present

## 2023-10-10 DIAGNOSIS — J439 Emphysema, unspecified: Secondary | ICD-10-CM | POA: Diagnosis not present

## 2023-10-10 DIAGNOSIS — R0602 Shortness of breath: Secondary | ICD-10-CM | POA: Diagnosis not present

## 2023-10-10 DIAGNOSIS — I2510 Atherosclerotic heart disease of native coronary artery without angina pectoris: Secondary | ICD-10-CM | POA: Diagnosis not present

## 2023-10-18 LAB — COLOGUARD: COLOGUARD: NEGATIVE

## 2023-12-25 ENCOUNTER — Ambulatory Visit
Admission: RE | Admit: 2023-12-25 | Discharge: 2023-12-25 | Disposition: A | Discharge status: Home / Self Care | Source: Ambulatory Visit | Attending: Pulmonary Disease | Admitting: Pulmonary Disease

## 2023-12-25 ENCOUNTER — Encounter: Payer: Self-pay | Admitting: Pulmonary Disease

## 2023-12-25 ENCOUNTER — Ambulatory Visit: Admitting: Pulmonary Disease

## 2023-12-25 VITALS — BP 118/70 | HR 90 | Temp 97.8°F | Ht 73.0 in | Wt 281.0 lb

## 2023-12-25 DIAGNOSIS — R0602 Shortness of breath: Secondary | ICD-10-CM

## 2023-12-25 LAB — NITRIC OXIDE: Nitric Oxide: 28

## 2023-12-25 MED ORDER — BREZTRI AEROSPHERE 160-9-4.8 MCG/ACT IN AERO: 2.0000 | INHALATION_SPRAY | Freq: Two times a day (BID) | RESPIRATORY_TRACT | 0 refills | Status: AC

## 2023-12-25 MED ORDER — BREZTRI AEROSPHERE 160-9-4.8 MCG/ACT IN AERO: 2.0000 | INHALATION_SPRAY | Freq: Two times a day (BID) | RESPIRATORY_TRACT | 3 refills | Status: AC

## 2023-12-25 MED ORDER — AEROCHAMBER MV MISC: 0 refills | Status: AC

## 2023-12-25 NOTE — Progress Notes (Signed)
 Synopsis: Referred in by Montey Lot, PA-C   Subjective:   PATIENT ID: Keith Jennings GENDER: male DOB: 1965/12/22, MRN: [id number]  Chief Complaint  Patient presents with   Shortness of Breath    Hard to get a deep breath that has been going on for months. No wheezing or cough.     HPI Keith Jennings is a pleasant 58 year old male patient with a past medical history of tobacco use, hyperlipidemia, seasonal allergies presenting today to the pulmonary clinic for further evaluation of shortness of breath.   He reports that about 2 to 3 months ago started having difficulty taking a deep breath.  He reports symptoms specifically at night.  He denies any cough, fever, chest pain, hemoptysis, wheezing.  He was started on Trelegy as outpatient every day for 1 month without any significant improvement.  He does report loud snoring at night with paroxysmal nocturnal dyspnea and witnessed apneas.  However he is reluctant ongoing management via.  Low-dose CT chest in June 2025 reviewed and showed centrilobular and paraseptal emphysematous changes no other suspicious lesions.  He denies any family history of lung disease  He quit smoking 2 months ago.  However smoked 1 pack/day for 40 years.    Family History  Problem Relation Age of Onset   Colon cancer Neg Hx    Colon polyps Neg Hx    Esophageal cancer Neg Hx    Rectal cancer Neg Hx    Stomach cancer Neg Hx      Social History   Socioeconomic History   Marital status: Married    Spouse name: Not on file   Number of children: Not on file   Years of education: Not on file   Highest education level: Not on file  Occupational History   Not on file  Tobacco Use   Smoking status: Former    Current packs/day: 0.00    Types: Cigarettes    Quit date: 10/24/2023    Years since quitting: 0.1   Smokeless tobacco: Never   Tobacco comments:    Quit smoking 10/2023        Started smoking at 58 years old    Smoked 1.5 PPD at his  heaviest.  Substance and Sexual Activity   Alcohol  use: Yes    Comment: 1-2 beers daily   Drug use: No   Sexual activity: Not on file  Other Topics Concern   Not on file  Social History Narrative   Not on file   Social Drivers of Health   Financial Resource Strain: Not on file  Food Insecurity: Low Risk  (06/06/2022)   Received from [facility]   Hunger Vital Sign    Within the past 12 months, you worried that your food would run out before you got money to buy more: Never true    Within the past 12 months, the food you bought just didn't last and you didn't have money to get more. : Never true  Transportation Needs: No Transportation Needs (06/06/2022)   Received from [facility]    In the past 12 months, has lack of reliable transportation kept you from medical appointments, meetings, work or from getting things needed for daily living? : No  Physical Activity: Not on file  Stress: Not on file  Social Connections: Not on file  Intimate Partner Violence: Not on file        Objective:   Vitals:   12/25/23 1459  BP: 118/70  Pulse: 90  Temp: 97.8 F (36.6 C)  SpO2: 95%  Weight: 281 lb (127.5 kg)  Height: 6' 1 (1.854 m)   95% on  RA BMI Readings from Last 3 Encounters:  12/25/23 37.07 kg/m  07/23/22 35.38 kg/m  04/04/17 36.16 kg/m   Wt Readings from Last 3 Encounters:  12/25/23 281 lb (127.5 kg)  07/23/22 268 lb 3.2 oz (121.7 kg)  04/04/17 266 lb 9.6 oz (120.9 kg)    Physical Exam GEN: NAD, obese  HEENT: Supple Neck, Reactive Pupils, EOMI  CVS: Normal S1, Normal S2, RRR, No murmurs or ES appreciated  Lungs: Clear bilateral air entry.  Abdomen: Soft, non tender, non distended, + BS  Extremities: Warm and well perfused, No edema    Ancillary Information   CBC    Component Value Date/Time   HGB 15.0 06/30/2021 2047   HCT 44.0 06/30/2021 2047   Labs and imaging were reviewed.      No data to display           Assessment  & Plan:  Keith Jennings is a pleasant 58 year old male patient with a past medical history of tobacco use, hyperlipidemia, seasonal allergies presenting today to the pulmonary clinic for further evaluation of shortness of breath.   Is feeling of chest tightness is likely secondary to COPD.  However we do not have PFTs on file to assess the degree of obstructive lung disease.  Feno is 28 which is indeterminant for pulmonary eosinophilic inflammation.  She denies any chest pain and no dyspnea therefore no cardiac source is less likely.  He did have a visit back in June where his troponins were checked and were negative.  However he does not have any echocardiogram on file.  Finally he does have evidence suggestive of obstructive sleep apnea including witnessed apneic episodes at night, loud snoring and fatigue.  []  Obtain PFTs. []  Start Breztri 2 puffs twice a day.  AeroChamber provided.  Advised mouth rinsing. []  Continue with albuterol as needed. []  Obtain echocardiogram []  Chest x-ray []  HST  Return in about 3 months (around 03/26/2024).  I personally spent a total of 60 minutes in the care of the patient today including preparing to see the patient, getting/reviewing separately obtained history, performing a medically appropriate exam/evaluation, counseling and educating, placing orders, documenting clinical information in the EHR, and independently interpreting results.   Darrin Barn, MD [facility] 12/25/2023 6:38 PM

## 2023-12-29 ENCOUNTER — Ambulatory Visit: Admitting: Pulmonary Disease

## 2023-12-29 ENCOUNTER — Ambulatory Visit: Payer: Self-pay | Admitting: Physician Assistant

## 2023-12-29 ENCOUNTER — Encounter: Payer: Self-pay | Admitting: Pulmonary Disease

## 2023-12-29 VITALS — BP 128/70 | HR 76 | Temp 97.6°F | Ht 73.0 in | Wt 274.0 lb

## 2023-12-29 DIAGNOSIS — J441 Chronic obstructive pulmonary disease with (acute) exacerbation: Secondary | ICD-10-CM

## 2023-12-29 MED ORDER — PREDNISONE 10 MG PO TABS: 40.0000 mg | ORAL_TABLET | Freq: Every day | ORAL | 0 refills | Status: AC

## 2023-12-29 NOTE — Telephone Encounter (Signed)
 Noted, NFN

## 2023-12-29 NOTE — Telephone Encounter (Signed)
 FYI Only or Action Required?: FYI only for provider.  Patient is followed in Pulmonology for SOB, last seen on 12/25/2023 by Malka Domino, MD.  Called Nurse Triage reporting Shortness of Breath.  Symptoms began yesterday.  Triage Disposition: See HCP Within 4 Hours (Or PCP Triage)  Patient/caregiver understands and will follow disposition?: Yes                        Copied from CRM [id number]. Topic: Clinical - Red Word Triage >> Dec 29, 2023  8:53 AM Nathanel DEL wrote: Red Word that prompted transfer to Nurse Triage: wife Clotilda calling to say that since last night pt has had breathing issues, where he cannot take a deep breath. Reason for Disposition  [1] MILD difficulty breathing (e.g., minimal/no SOB at rest, SOB with walking, pulse < 100) AND [2] NEW-onset or WORSE than normal  Answer Assessment - Initial Assessment Questions This RN spoke with pt's wife, Clotilda. Pt was seen on 10/2 by Dr. Malka for his first visit. Pt intermittently has issues taking a deep breath per pt wife. This RN scheduled pt for an appt this morning as pt did not want to wait to be seen this afternoon. Pt wife aware of location being in [place]. This RN educated pt wife on new-worsening symptoms and when to call back/seek emergent care. Pt wife verbalized understanding and agrees to plan.   This started again last night and into this morning Intermittent cough (dry, not coughing up mucous), wheeze with inhale, trying to get something up Feels like something stuck at throat, per pt either bronchial tubes swollen or mucous Denies dizziness, chest pain  O2 SATURATION MONITOR:  Do you use an oxygen saturation monitor (pulse oximeter) at home? If Yes, ask: What is your reading (oxygen level) today? What is your usual oxygen saturation reading? (e.g., 95%)       97% on room air  Protocols used: Breathing Difficulty-A-AH

## 2023-12-29 NOTE — Progress Notes (Signed)
 Keith Jennings    [id number]    1965/12/04  Primary Care Physician:Conroy, Rankin RIGGERS  Referring Physician: Montey Rankin, PA-C [address] [place],  [place] [address]  Chief complaint: Acute visit for dyspnea, wheeze  HPI: 58 y.o. who  has a past medical history of Allergy , Arthritis, Medical history non-contributory, Thyroid  disease, and Wears glasses.  Discussed the use of AI scribe software for clinical note transcription with the patient, who gave verbal consent to proceed.  History of Present Illness Keith Jennings is a 58 year old male who presents with worsening shortness of breath and wheezing. He was recently seen by my partner Dr. Malka for further evaluation of his respiratory symptoms.  Dyspnea and wheezing - Worsening shortness of breath and wheezing, with episodes occurring every few weeks - Significant exacerbation last night, with severe shortness of breath and wheezing beginning around 9:30 PM and persisting through the night and into the morning - Difficulty taking a deep breath, especially when lying down, with a sensation of obstruction - Tightening sensation in the chest accompanying symptoms - Wheezing audible to his wife from a distance - Wheezing more pronounced in the throat area - No significant lung congestion  Tobacco use history - 40 pack-year smoking history - Quit smoking a few months ago  Inhaler therapy and adverse effects - Uses Breztri inhaler, two puffs twice daily - Experiences jitteriness after the evening dose of Breztri - Previously used Trelegy inhaler for approximately six weeks without symptom relief   Outpatient Encounter Medications as of 12/29/2023  Medication Sig   albuterol (PROVENTIL) (2.5 MG/3ML) 0.083% nebulizer solution Take 2.5 mg by nebulization every 6 (six) hours as needed for wheezing or shortness of breath.   albuterol (VENTOLIN HFA) 108 (90 Base) MCG/ACT inhaler Inhale 2 puffs into the  lungs every 4 (four) hours as needed.   atorvastatin (LIPITOR) 10 MG tablet Take 10 mg by mouth daily.   budesonide-glycopyrrolate-formoterol (BREZTRI AEROSPHERE) 160-9-4.8 MCG/ACT AERO inhaler Inhale 2 puffs into the lungs 2 (two) times daily.   levocetirizine (XYZAL ) 5 MG tablet Take 1 tablet (5 mg total) by mouth every evening.   levothyroxine (SYNTHROID, LEVOTHROID) 100 MCG tablet Take 100 mcg by mouth daily.   Olopatadine-Mometasone (RYALTRIS ) 665-25 MCG/ACT SUSP two sprays per nostril 1-2 times daily as needed   Spacer/Aero-Holding Chambers (AEROCHAMBER MV) inhaler Use as instructed   budesonide-glycopyrrolate-formoterol (BREZTRI AEROSPHERE) 160-9-4.8 MCG/ACT AERO inhaler Inhale 2 puffs into the lungs in the morning and at bedtime. (Patient not taking: Reported on 12/29/2023)   No facility-administered encounter medications on file as of 12/29/2023.     Physical Exam: Today's Vitals   12/29/23 1032  BP: 128/70  Pulse: 76  Temp: 97.6 F (36.4 C)  TempSrc: Oral  SpO2: 97%  Weight: 274 lb (124.3 kg)  Height: 6' 1 (1.854 m)   Body mass index is 36.15 kg/m.  Physical Exam GEN: No acute distress. CV: Regular rate and rhythm, no murmurs. LUNGS: Clear to auscultation bilaterally, normal respiratory effort. SKIN JOINTS: Warm and dry, no rash.    Data Reviewed: Imaging: Screening CT chest 09/19/2023-mild centrilobular, paraseptal emphysema.  3.3 mm subpleural nodule.  Chest x-ray 12/25/2023-radiologist read is pending.  By my review there does not appear to be any pneumonia. I have reviewed the images personally.  PFTs: 07/23/2022 FVC 3.96 [74%], FEV1 3.00 [73%], F/F76 Normal test  Labs:  Assessment & Plan Emphysema with acute exacerbation Acute exacerbation of  emphysema with significant wheezing and dyspnea, particularly nocturnal. Symptoms include orthopnea and sensation of airway obstruction. Audible wheezing is severe. Previous chest x-ray showed no pneumonia.  Currently on Breztri inhaler, experiencing jitteriness with nighttime doses. Previous trial of Trelegy was ineffective. Further workup already planned with lung function tests, echocardiogram, and sleep study ordered last week. - Continue Breztri inhaler, adjust dose if jitteriness persists. - Prescribe prednisone  40 mg/day for 5 days to reduce wheezing. - Follow-up on lung function test, echocardiogram, home sleep study.  Tobacco use disorder 40 pack-year smoking history, quit a few months ago.    Lonna Coder MD [facility] 12/29/2023, 10:40 AM  CC: Montey Lot, PA-C

## 2023-12-29 NOTE — Patient Instructions (Signed)
  VISIT SUMMARY: You came in today due to worsening shortness of breath and wheezing. We discussed your history of emphysema and recent exacerbation of symptoms, including difficulty breathing at night. We also reviewed your smoking history and current inhaler use.  YOUR PLAN: EMPHYSEMA WITH ACUTE EXACERBATION: You are experiencing a worsening of your emphysema symptoms, including severe wheezing and shortness of breath, especially at night. -Continue using your Breztri inhaler, but we may adjust the dose if you continue to feel jittery. -Take prednisone  for 5 days to help reduce the wheezing. -We will schedule lung function tests to better understand your condition. -We will also schedule an echocardiogram to check your heart function. -A home sleep study will be arranged to see if sleep issues are contributing to your symptoms.  TOBACCO USE DISORDER: You have a long history of smoking, which has contributed to your lung condition. It is very important that you continue to stay away from smoking to prevent further damage. -Continue to avoid smoking to help manage your emphysema and protect your lungs.

## 2024-01-20 ENCOUNTER — Encounter

## 2024-01-20 DIAGNOSIS — G4730 Sleep apnea, unspecified: Secondary | ICD-10-CM | POA: Diagnosis not present

## 2024-01-20 DIAGNOSIS — R0602 Shortness of breath: Secondary | ICD-10-CM

## 2024-01-21 DIAGNOSIS — R0683 Snoring: Secondary | ICD-10-CM | POA: Diagnosis not present

## 2024-01-28 DIAGNOSIS — R0789 Other chest pain: Secondary | ICD-10-CM | POA: Diagnosis not present

## 2024-01-29 ENCOUNTER — Other Ambulatory Visit: Payer: Self-pay | Admitting: Physician Assistant

## 2024-01-29 DIAGNOSIS — M899 Disorder of bone, unspecified: Secondary | ICD-10-CM

## 2024-01-30 ENCOUNTER — Ambulatory Visit
Admission: RE | Admit: 2024-01-30 | Discharge: 2024-01-30 | Disposition: A | Discharge status: Home / Self Care | Source: Ambulatory Visit | Attending: Physician Assistant | Admitting: Physician Assistant

## 2024-01-30 DIAGNOSIS — R079 Chest pain, unspecified: Secondary | ICD-10-CM | POA: Diagnosis not present

## 2024-01-30 DIAGNOSIS — M899 Disorder of bone, unspecified: Secondary | ICD-10-CM

## 2024-02-02 ENCOUNTER — Ambulatory Visit
Admission: RE | Admit: 2024-02-02 | Discharge: 2024-02-02 | Disposition: A | Source: Ambulatory Visit | Attending: Pulmonary Disease

## 2024-02-02 DIAGNOSIS — R0602 Shortness of breath: Secondary | ICD-10-CM | POA: Diagnosis not present

## 2024-02-02 LAB — ECHOCARDIOGRAM COMPLETE
AR max vel: 2.38 cm2
AV Area VTI: 3.39 cm2
AV Area mean vel: 2.32 cm2
AV Mean grad: 3 mmHg
AV Peak grad: 4 mmHg
Ao pk vel: 1 m/s
Area-P 1/2: 4.52 cm2
MV VTI: 2.69 cm2
S' Lateral: 3.08 cm

## 2024-02-24 ENCOUNTER — Telehealth: Payer: Self-pay

## 2024-02-24 NOTE — Telephone Encounter (Signed)
 Copied from CRM #[id number]. Topic: Clinical - Medication Question >> Feb 24, 2024 10:21 AM Lavanda D wrote: Reason for CRM: Patient's wife is calling because the pharmacy has not been able to fill the breztri  prescription - she is wondering if it could be looked into. This would be sent to [facility]. Please call to advise if this is okay.

## 2024-02-25 NOTE — Telephone Encounter (Signed)
 Spoke with pharmacy team today and they confirmed he has picked up his breztri  inhaler yesterday. NFN.

## 2024-03-05 DIAGNOSIS — R7303 Prediabetes: Secondary | ICD-10-CM | POA: Diagnosis not present

## 2024-03-05 DIAGNOSIS — E039 Hypothyroidism, unspecified: Secondary | ICD-10-CM | POA: Diagnosis not present

## 2024-03-05 DIAGNOSIS — J439 Emphysema, unspecified: Secondary | ICD-10-CM | POA: Diagnosis not present

## 2024-03-05 DIAGNOSIS — I2510 Atherosclerotic heart disease of native coronary artery without angina pectoris: Secondary | ICD-10-CM | POA: Diagnosis not present

## 2024-03-08 ENCOUNTER — Encounter: Payer: Self-pay | Admitting: Pulmonary Disease

## 2024-03-08 ENCOUNTER — Ambulatory Visit: Admitting: Pulmonary Disease

## 2024-03-08 VITALS — BP 130/80 | HR 83 | Temp 98.4°F | Ht 73.0 in | Wt 283.0 lb

## 2024-03-08 DIAGNOSIS — E785 Hyperlipidemia, unspecified: Secondary | ICD-10-CM

## 2024-03-08 DIAGNOSIS — Z87891 Personal history of nicotine dependence: Secondary | ICD-10-CM | POA: Diagnosis not present

## 2024-03-08 DIAGNOSIS — G4733 Obstructive sleep apnea (adult) (pediatric): Secondary | ICD-10-CM

## 2024-03-08 DIAGNOSIS — R0602 Shortness of breath: Secondary | ICD-10-CM

## 2024-03-08 NOTE — Progress Notes (Signed)
 Synopsis: Referred in by Montey Lot, PA-C   Subjective:   PATIENT ID: Keith Jennings GENDER: male DOB: 01-20-1966, MRN: [id number]  Chief Complaint  Patient presents with   Shortness of Breath    SOB. No wheezing. Occasional wheezing and cough.  Breztri - BID, helps with his breathing. Albuterol- BID.    HPI Keith Jennings is a pleasant 58 year old male patient with a past medical history of tobacco use, hyperlipidemia, seasonal allergies presenting today to the pulmonary clinic for further evaluation of shortness of breath.   He reports that about 2 to 3 months ago started having difficulty taking a deep breath.  He reports symptoms specifically at night.  He denies any cough, fever, chest pain, hemoptysis, wheezing.  He was started on Trelegy as outpatient every day for 1 month without any significant improvement.  He does report loud snoring at night with paroxysmal nocturnal dyspnea and witnessed apneas.  However he is reluctant ongoing management via.  Low-dose CT chest in June 2025 reviewed and showed centrilobular and paraseptal emphysematous changes no other suspicious lesions.  He denies any family history of lung disease  He quit smoking 2 months ago.  However smoked 1 pack/day for 40 years.   OV 03/08/2024 -Keith Jennings is here to follow-up on his echocardiogram results, CT chest results and sleep study.  His echocardiogram is reassuring and does not show any signs of pulmonary hypertension.  His CT chest does not show any signs of significant parenchymal lung disease but does show centrilobular emphysema.  His sleep study does show moderate obstructive sleep apnea for which we will start CPAP therapy.  I will also refer him to see Dr. Jess.  Unfortunately he continues to vape we discussed different strategies for vaping cessation he is motivated and will try.  Fortunately was not able to do pulmonary function testing, we will have that scheduled and in the meantime he  will continue with Breztri  2 puffs twice a day and albuterol as needed.  Family History  Problem Relation Age of Onset   Colon cancer Neg Hx    Colon polyps Neg Hx    Esophageal cancer Neg Hx    Rectal cancer Neg Hx    Stomach cancer Neg Hx      Social History   Socioeconomic History   Marital status: Married    Spouse name: Not on file   Number of children: Not on file   Years of education: Not on file   Highest education level: Not on file  Occupational History   Not on file  Tobacco Use   Smoking status: Former    Current packs/day: 0.00    Types: Cigarettes    Quit date: 10/24/2023    Years since quitting: 0.3   Smokeless tobacco: Never   Tobacco comments:    Quit smoking 10/2023        Started smoking at 58 years old    Smoked 1.5 PPD at his heaviest.  Substance and Sexual Activity   Alcohol  use: Yes    Comment: 1-2 beers daily   Drug use: No   Sexual activity: Not on file  Other Topics Concern   Not on file  Social History Narrative   Not on file   Social Drivers of Health   Tobacco Use: Medium Risk (03/08/2024)   Patient History    Smoking Tobacco Use: Former    Smokeless Tobacco Use: Never    Passive Exposure: Not on Actuary Strain: Not  on file  Food Insecurity: Low Risk (06/06/2022)   Received from [facility]   Epic    Within the past 12 months, you worried that your food would run out before you got money to buy more: Never true    Within the past 12 months, the food you bought just didn't last and you didn't have money to get more. : Never true  Transportation Needs: No Transportation Needs (06/06/2022)   Received from [facility]    In the past 12 months, has lack of reliable transportation kept you from medical appointments, meetings, work or from getting things needed for daily living? : No  Physical Activity: Not on file  Stress: Not on file  Social Connections: Not on file  Intimate Partner Violence: Not  on file  Depression (EYV7-0): Not on file  Alcohol  Screen: Not on file  Housing: Low Risk (06/06/2022)   Received from [facility]   Epic    What is your living situation today?: I have a steady place to live    Think about the place you live. Do you have problems with any of the following? Choose all that apply:: None/None on this list  Utilities: Low Risk (06/06/2022)   Received from [facility]   Utilities    In the past 12 months has the electric, gas, oil, or water company threatened to shut off services in your home? : No  Health Literacy: Not on file        Objective:   Vitals:   03/08/24 1433  BP: 130/80  Pulse: 83  Temp: 98.4 F (36.9 C)  SpO2: 97%  Weight: 283 lb (128.4 kg)  Height: 6' 1 (1.854 m)   97% on  RA BMI Readings from Last 3 Encounters:  03/08/24 37.34 kg/m  12/29/23 36.15 kg/m  12/25/23 37.07 kg/m   Wt Readings from Last 3 Encounters:  03/08/24 283 lb (128.4 kg)  12/29/23 274 lb (124.3 kg)  12/25/23 281 lb (127.5 kg)    Physical Exam GEN: NAD, obese  HEENT: Supple Neck, Reactive Pupils, EOMI  CVS: Normal S1, Normal S2, RRR, No murmurs or ES appreciated  Lungs: Clear bilateral air entry.  Abdomen: Soft, non tender, non distended, + BS  Extremities: Warm and well perfused, No edema   Labs and imaging were reviewed.  Ancillary Information   CBC    Component Value Date/Time   HGB 15.0 06/30/2021 2047   HCT 44.0 06/30/2021 2047   Labs and imaging were reviewed.      No data to display           Assessment & Plan:  Keith Jennings is a pleasant 58 year old male patient with a past medical history of tobacco use, hyperlipidemia, seasonal allergies presenting today to the pulmonary clinic for further evaluation of shortness of breath.   Is feeling of chest tightness is likely secondary to COPD.  However we do not have PFTs on file to assess the degree of obstructive lung disease.  Feno is 28 which is indeterminant for pulmonary  eosinophilic inflammation.  She denies any chest pain and no dyspnea therefore no cardiac source is less likely.  He did have a visit back in June where his troponins were checked and were negative.    His echocardiogram is reassuring and does not show any signs of pulmonary hypertension.  His CT chest does not show any signs of significant parenchymal lung disease but does show centrilobular emphysema.  His sleep study  does show moderate obstructive sleep apnea   []  Obtain PFTs. []  c/w  Breztri  2 puffs twice a day.  AeroChamber provided.  Advised mouth rinsing. []  Continue with albuterol as needed. []  Start auto cpap 5-15 cmH2O HH FF and follow up with Dr. Jess.   Smoking/Tobacco Cessation Counseling Keith Jennings is a current user of tobacco or nicotine products. He is considering quitting at this time. Counseling provided today addressed the risks of continued use and the benefits of cessation. Discussed tobacco/nicotine use history, readiness to quit, and evidence-based treatment options including behavioral strategies, support resources, and pharmacologic therapies. Provided encouragement and educational materials on steps and resources to quit smoking. Patient questions were addressed, and follow-up recommended for continued support. Total time spent on counseling: 4 minutes.    No follow-ups on file.  I personally spent a total of 40 minutes in the care of the patient today including preparing to see the patient, getting/reviewing separately obtained history, performing a medically appropriate exam/evaluation, counseling and educating, placing orders, documenting clinical information in the EHR, independently interpreting results, and communicating results.   Darrin Barn, MD [facility] 03/08/2024 5:04 PM
